# Patient Record
Sex: Male | Born: 1970 | Race: White | Hispanic: No | Smoking: Current every day smoker
Health system: Southern US, Community
[De-identification: ages and names within clinical notes are randomized; demographics above are authoritative.]

## PROBLEM LIST (undated history)

## (undated) DIAGNOSIS — F988 Other specified behavioral and emotional disorders with onset usually occurring in childhood and adolescence: Secondary | ICD-10-CM

## (undated) DIAGNOSIS — G8929 Other chronic pain: Secondary | ICD-10-CM

## (undated) DIAGNOSIS — M1990 Unspecified osteoarthritis, unspecified site: Secondary | ICD-10-CM

## (undated) DIAGNOSIS — F32A Depression, unspecified: Secondary | ICD-10-CM

## (undated) DIAGNOSIS — R569 Unspecified convulsions: Secondary | ICD-10-CM

## (undated) DIAGNOSIS — F329 Major depressive disorder, single episode, unspecified: Secondary | ICD-10-CM

## (undated) DIAGNOSIS — M549 Dorsalgia, unspecified: Secondary | ICD-10-CM

## (undated) DIAGNOSIS — J449 Chronic obstructive pulmonary disease, unspecified: Secondary | ICD-10-CM

## (undated) HISTORY — DX: Unspecified osteoarthritis, unspecified site: M19.90

## (undated) HISTORY — DX: Unspecified convulsions: R56.9

## (undated) HISTORY — DX: Chronic obstructive pulmonary disease, unspecified: J44.9

---

## 2001-12-02 ENCOUNTER — Emergency Department (HOSPITAL_COMMUNITY): Admission: EM | Admit: 2001-12-02 | Discharge: 2001-12-02 | Payer: Self-pay | Admitting: *Deleted

## 2001-12-02 ENCOUNTER — Encounter: Payer: Self-pay | Admitting: *Deleted

## 2002-10-14 ENCOUNTER — Encounter: Payer: Self-pay | Admitting: Emergency Medicine

## 2002-10-14 ENCOUNTER — Emergency Department (HOSPITAL_COMMUNITY): Admission: EM | Admit: 2002-10-14 | Discharge: 2002-10-14 | Payer: Self-pay | Admitting: Emergency Medicine

## 2004-12-30 ENCOUNTER — Emergency Department (HOSPITAL_COMMUNITY): Admission: EM | Admit: 2004-12-30 | Discharge: 2004-12-31 | Payer: Self-pay | Admitting: Emergency Medicine

## 2009-03-12 ENCOUNTER — Emergency Department (HOSPITAL_COMMUNITY): Admission: EM | Admit: 2009-03-12 | Discharge: 2009-03-13 | Payer: Self-pay | Admitting: Emergency Medicine

## 2009-03-13 ENCOUNTER — Inpatient Hospital Stay (HOSPITAL_COMMUNITY): Admission: EM | Admit: 2009-03-13 | Discharge: 2009-03-14 | Payer: Self-pay | Admitting: *Deleted

## 2009-03-13 ENCOUNTER — Ambulatory Visit: Payer: Self-pay | Admitting: *Deleted

## 2009-03-15 ENCOUNTER — Inpatient Hospital Stay (HOSPITAL_COMMUNITY): Admission: EM | Admit: 2009-03-15 | Discharge: 2009-03-17 | Payer: Self-pay | Admitting: Emergency Medicine

## 2009-03-17 ENCOUNTER — Inpatient Hospital Stay (HOSPITAL_COMMUNITY): Admission: AD | Admit: 2009-03-17 | Discharge: 2009-03-24 | Payer: Self-pay | Admitting: *Deleted

## 2009-04-01 ENCOUNTER — Emergency Department (HOSPITAL_COMMUNITY): Admission: EM | Admit: 2009-04-01 | Discharge: 2009-04-01 | Payer: Self-pay | Admitting: Emergency Medicine

## 2009-04-07 ENCOUNTER — Emergency Department (HOSPITAL_COMMUNITY): Admission: EM | Admit: 2009-04-07 | Discharge: 2009-04-08 | Payer: Self-pay | Admitting: Emergency Medicine

## 2009-04-12 ENCOUNTER — Emergency Department: Payer: Self-pay | Admitting: Unknown Physician Specialty

## 2009-04-15 ENCOUNTER — Emergency Department (HOSPITAL_COMMUNITY): Admission: EM | Admit: 2009-04-15 | Discharge: 2009-04-15 | Payer: Self-pay | Admitting: Emergency Medicine

## 2009-06-22 ENCOUNTER — Emergency Department (HOSPITAL_COMMUNITY): Admission: EM | Admit: 2009-06-22 | Discharge: 2009-06-22 | Payer: Self-pay | Admitting: Emergency Medicine

## 2009-06-23 ENCOUNTER — Ambulatory Visit: Payer: Self-pay | Admitting: Psychiatry

## 2009-06-23 ENCOUNTER — Inpatient Hospital Stay (HOSPITAL_COMMUNITY): Admission: AD | Admit: 2009-06-23 | Discharge: 2009-06-30 | Payer: Self-pay | Admitting: Psychiatry

## 2009-09-14 ENCOUNTER — Emergency Department (HOSPITAL_COMMUNITY): Admission: EM | Admit: 2009-09-14 | Discharge: 2009-09-15 | Payer: Self-pay | Admitting: Emergency Medicine

## 2009-09-15 ENCOUNTER — Inpatient Hospital Stay (HOSPITAL_COMMUNITY): Admission: AD | Admit: 2009-09-15 | Discharge: 2009-09-22 | Payer: Self-pay | Admitting: Psychiatry

## 2009-09-16 ENCOUNTER — Ambulatory Visit: Payer: Self-pay | Admitting: Psychiatry

## 2009-12-06 ENCOUNTER — Emergency Department (HOSPITAL_COMMUNITY): Admission: EM | Admit: 2009-12-06 | Discharge: 2009-12-08 | Payer: Self-pay | Admitting: Emergency Medicine

## 2009-12-08 ENCOUNTER — Inpatient Hospital Stay (HOSPITAL_COMMUNITY): Admission: AD | Admit: 2009-12-08 | Discharge: 2009-12-15 | Payer: Self-pay | Admitting: Psychiatry

## 2009-12-08 ENCOUNTER — Ambulatory Visit: Payer: Self-pay | Admitting: Psychiatry

## 2009-12-16 ENCOUNTER — Emergency Department (HOSPITAL_COMMUNITY): Admission: EM | Admit: 2009-12-16 | Discharge: 2009-12-17 | Payer: Self-pay | Admitting: Emergency Medicine

## 2009-12-17 ENCOUNTER — Inpatient Hospital Stay (HOSPITAL_COMMUNITY): Admission: RE | Admit: 2009-12-17 | Discharge: 2009-12-24 | Payer: Self-pay | Admitting: Psychiatry

## 2009-12-17 ENCOUNTER — Ambulatory Visit: Payer: Self-pay | Admitting: Psychiatry

## 2009-12-30 ENCOUNTER — Other Ambulatory Visit: Payer: Self-pay | Admitting: Emergency Medicine

## 2009-12-30 ENCOUNTER — Other Ambulatory Visit: Payer: Self-pay

## 2009-12-30 ENCOUNTER — Emergency Department (HOSPITAL_COMMUNITY): Admission: EM | Admit: 2009-12-30 | Discharge: 2010-01-01 | Payer: Self-pay | Admitting: Emergency Medicine

## 2010-03-29 ENCOUNTER — Emergency Department (HOSPITAL_COMMUNITY): Admission: EM | Admit: 2010-03-29 | Discharge: 2010-03-29 | Payer: Self-pay | Admitting: Emergency Medicine

## 2010-03-29 ENCOUNTER — Emergency Department (HOSPITAL_COMMUNITY): Admission: EM | Admit: 2010-03-29 | Discharge: 2010-03-30 | Payer: Self-pay | Admitting: Family Medicine

## 2010-03-30 ENCOUNTER — Ambulatory Visit: Payer: Self-pay | Admitting: Psychiatry

## 2010-03-30 ENCOUNTER — Inpatient Hospital Stay (HOSPITAL_COMMUNITY): Admission: AD | Admit: 2010-03-30 | Discharge: 2010-04-01 | Payer: Self-pay | Admitting: Psychiatry

## 2010-04-01 ENCOUNTER — Emergency Department (HOSPITAL_COMMUNITY): Admission: EM | Admit: 2010-04-01 | Discharge: 2010-04-01 | Payer: Self-pay | Admitting: Emergency Medicine

## 2010-10-30 ENCOUNTER — Emergency Department (HOSPITAL_COMMUNITY)
Admission: EM | Admit: 2010-10-30 | Discharge: 2010-10-30 | Payer: Self-pay | Source: Home / Self Care | Admitting: Emergency Medicine

## 2011-01-18 LAB — URINALYSIS, ROUTINE W REFLEX MICROSCOPIC
Bilirubin Urine: NEGATIVE
Glucose, UA: NEGATIVE mg/dL
Hgb urine dipstick: NEGATIVE
Ketones, ur: NEGATIVE mg/dL
Nitrite: NEGATIVE
Protein, ur: NEGATIVE mg/dL
Specific Gravity, Urine: 1.026 (ref 1.005–1.030)
Urobilinogen, UA: 0.2 mg/dL (ref 0.0–1.0)
pH: 5.5 (ref 5.0–8.0)

## 2011-01-18 LAB — POCT I-STAT, CHEM 8
Calcium, Ion: 1.13 mmol/L (ref 1.12–1.32)
Creatinine, Ser: 1.1 mg/dL (ref 0.4–1.5)
Hemoglobin: 12.9 g/dL — ABNORMAL LOW (ref 13.0–17.0)
Sodium: 140 mEq/L (ref 135–145)
TCO2: 25 mmol/L (ref 0–100)

## 2011-01-18 LAB — RAPID URINE DRUG SCREEN, HOSP PERFORMED
Amphetamines: NOT DETECTED
Cocaine: NOT DETECTED
Opiates: POSITIVE — AB
Tetrahydrocannabinol: NOT DETECTED

## 2011-01-18 LAB — DIFFERENTIAL
Basophils Relative: 0 % (ref 0–1)
Eosinophils Absolute: 0.3 10*3/uL (ref 0.0–0.7)
Monocytes Relative: 11 % (ref 3–12)
Neutrophils Relative %: 72 % (ref 43–77)

## 2011-01-18 LAB — CBC
MCHC: 35 g/dL (ref 30.0–36.0)
MCV: 93.3 fL (ref 78.0–100.0)
Platelets: 251 10*3/uL (ref 150–400)

## 2011-01-18 LAB — SALICYLATE LEVEL: Salicylate Lvl: <4 mg/dL (ref 2.8–20.0)

## 2011-01-20 LAB — CBC
Hemoglobin: 13.5 g/dL (ref 13.0–17.0)
MCHC: 33.7 g/dL (ref 30.0–36.0)
MCV: 92.9 fL (ref 78.0–100.0)
MCV: 94.7 fL (ref 78.0–100.0)
Platelets: 269 10*3/uL (ref 150–400)
RBC: 4.24 MIL/uL (ref 4.22–5.81)
RDW: 13.6 % (ref 11.5–15.5)
RDW: 13.6 % (ref 11.5–15.5)
WBC: 6 10*3/uL (ref 4.0–10.5)

## 2011-01-20 LAB — RAPID URINE DRUG SCREEN, HOSP PERFORMED
Benzodiazepines: POSITIVE — AB
Benzodiazepines: POSITIVE — AB
Cocaine: NOT DETECTED
Cocaine: NOT DETECTED
Opiates: NOT DETECTED
Opiates: NOT DETECTED
Tetrahydrocannabinol: NOT DETECTED

## 2011-01-20 LAB — URINALYSIS, ROUTINE W REFLEX MICROSCOPIC
Bilirubin Urine: NEGATIVE
Bilirubin Urine: NEGATIVE
Glucose, UA: NEGATIVE mg/dL
Glucose, UA: NEGATIVE mg/dL
Ketones, ur: NEGATIVE mg/dL
Ketones, ur: NEGATIVE mg/dL
Nitrite: NEGATIVE
Specific Gravity, Urine: 1.036 — ABNORMAL HIGH (ref 1.005–1.030)
Specific Gravity, Urine: 1.021 (ref 1.005–1.030)
pH: 5.5 (ref 5.0–8.0)
pH: 7.5 (ref 5.0–8.0)

## 2011-01-20 LAB — DIFFERENTIAL
Eosinophils Absolute: 0.2 10*3/uL (ref 0.0–0.7)
Eosinophils Relative: 2 % (ref 0–5)
Lymphocytes Relative: 25 % (ref 12–46)
Lymphocytes Relative: 25 % (ref 12–46)
Lymphs Abs: 1.5 10*3/uL (ref 0.7–4.0)
Lymphs Abs: 1.8 10*3/uL (ref 0.7–4.0)
Monocytes Absolute: 1.1 10*3/uL — ABNORMAL HIGH (ref 0.1–1.0)
Monocytes Relative: 18 % — ABNORMAL HIGH (ref 3–12)
Monocytes Relative: 12 % (ref 3–12)
Neutro Abs: 3.3 10*3/uL (ref 1.7–7.7)
Neutro Abs: 4.3 10*3/uL (ref 1.7–7.7)
Neutrophils Relative %: 61 % (ref 43–77)

## 2011-01-20 LAB — COMPREHENSIVE METABOLIC PANEL
AST: 42 U/L — ABNORMAL HIGH (ref 0–37)
Albumin: 3.9 g/dL (ref 3.5–5.2)
BUN: 18 mg/dL (ref 6–23)
CO2: 29 mEq/L (ref 19–32)
Calcium: 9.2 mg/dL (ref 8.4–10.5)
Calcium: 9 mg/dL (ref 8.4–10.5)
Chloride: 99 mEq/L (ref 96–112)
Creatinine, Ser: 1.07 mg/dL (ref 0.4–1.5)
Creatinine, Ser: 1.05 mg/dL (ref 0.4–1.5)
GFR calc Af Amer: >60 mL/min (ref >60)
GFR calc non Af Amer: >60 mL/min (ref >60)
Glucose, Bld: 91 mg/dL (ref 70–99)
Total Protein: 7.8 g/dL (ref 6.0–8.3)
Total Protein: 7.2 g/dL (ref 6.0–8.3)

## 2011-01-20 LAB — POCT CARDIAC MARKERS: Troponin i, poc: <0.05 ng/mL (ref 0.00–0.09)

## 2011-01-20 LAB — ACETAMINOPHEN LEVEL: Acetaminophen (Tylenol), Serum: <10 ug/mL — ABNORMAL LOW (ref 10–30)

## 2011-01-24 LAB — RAPID URINE DRUG SCREEN, HOSP PERFORMED
Amphetamines: NOT DETECTED
Barbiturates: NOT DETECTED
Cocaine: NOT DETECTED
Opiates: POSITIVE — AB

## 2011-01-24 LAB — DIFFERENTIAL
Basophils Absolute: 0.1 10*3/uL (ref 0.0–0.1)
Basophils Relative: 1 % (ref 0–1)
Eosinophils Absolute: 0.1 10*3/uL (ref 0.0–0.7)
Monocytes Absolute: 1.2 10*3/uL — ABNORMAL HIGH (ref 0.1–1.0)
Monocytes Relative: 16 % — ABNORMAL HIGH (ref 3–12)
Neutrophils Relative %: 64 % (ref 43–77)

## 2011-01-24 LAB — CBC
Hemoglobin: 14.7 g/dL (ref 13.0–17.0)
MCHC: 34.2 g/dL (ref 30.0–36.0)
MCV: 94 fL (ref 78.0–100.0)
RBC: 4.58 MIL/uL (ref 4.22–5.81)
RDW: 13.8 % (ref 11.5–15.5)

## 2011-01-24 LAB — POCT I-STAT, CHEM 8
Calcium, Ion: 1.1 mmol/L — ABNORMAL LOW (ref 1.12–1.32)
Creatinine, Ser: 1 mg/dL (ref 0.4–1.5)
Glucose, Bld: 88 mg/dL (ref 70–99)
Hemoglobin: 15.6 g/dL (ref 13.0–17.0)
Potassium: 3.9 mEq/L (ref 3.5–5.1)

## 2011-02-03 LAB — COMPREHENSIVE METABOLIC PANEL
ALT: 17 U/L (ref 0–53)
AST: 20 U/L (ref 0–37)
Alkaline Phosphatase: 39 U/L (ref 39–117)
CO2: 27 mEq/L (ref 19–32)
Calcium: 8.8 mg/dL (ref 8.4–10.5)
GFR calc Af Amer: >60 mL/min (ref >60)
GFR calc non Af Amer: >60 mL/min (ref >60)
Glucose, Bld: 98 mg/dL (ref 70–99)
Potassium: 3.5 mEq/L (ref 3.5–5.1)
Sodium: 139 mEq/L (ref 135–145)

## 2011-02-03 LAB — RAPID URINE DRUG SCREEN, HOSP PERFORMED: Tetrahydrocannabinol: NOT DETECTED

## 2011-02-03 LAB — ETHANOL: Alcohol, Ethyl (B): <5 mg/dL (ref 0–10)

## 2011-02-03 LAB — DIFFERENTIAL
Basophils Relative: 0 % (ref 0–1)
Eosinophils Absolute: 0.2 10*3/uL (ref 0.0–0.7)
Eosinophils Relative: 2 % (ref 0–5)
Lymphs Abs: 1.8 10*3/uL (ref 0.7–4.0)
Monocytes Relative: 11 % (ref 3–12)
Neutrophils Relative %: 65 % (ref 43–77)

## 2011-02-03 LAB — ACETAMINOPHEN LEVEL: Acetaminophen (Tylenol), Serum: <10 ug/mL — ABNORMAL LOW (ref 10–30)

## 2011-02-03 LAB — CBC
Hemoglobin: 13.6 g/dL (ref 13.0–17.0)
MCHC: 33.8 g/dL (ref 30.0–36.0)
RBC: 4.28 MIL/uL (ref 4.22–5.81)
WBC: 8.1 10*3/uL (ref 4.0–10.5)

## 2011-02-04 ENCOUNTER — Inpatient Hospital Stay (HOSPITAL_COMMUNITY)
Admission: EM | Admit: 2011-02-04 | Discharge: 2011-02-05 | DRG: 918 | Disposition: A | Discharge status: Other Acute Inpatient Hospital | Payer: Self-pay | Attending: Internal Medicine | Admitting: Internal Medicine

## 2011-02-04 DIAGNOSIS — Y92009 Unspecified place in unspecified non-institutional (private) residence as the place of occurrence of the external cause: Secondary | ICD-10-CM

## 2011-02-04 DIAGNOSIS — M4800 Spinal stenosis, site unspecified: Secondary | ICD-10-CM | POA: Diagnosis present

## 2011-02-04 DIAGNOSIS — T43502A Poisoning by unspecified antipsychotics and neuroleptics, intentional self-harm, initial encounter: Secondary | ICD-10-CM | POA: Diagnosis present

## 2011-02-04 DIAGNOSIS — T438X1A Poisoning by other psychotropic drugs, accidental (unintentional), initial encounter: Principal | ICD-10-CM | POA: Diagnosis present

## 2011-02-04 DIAGNOSIS — T438X4A Poisoning by other psychotropic drugs, undetermined, initial encounter: Principal | ICD-10-CM | POA: Diagnosis present

## 2011-02-04 DIAGNOSIS — F319 Bipolar disorder, unspecified: Secondary | ICD-10-CM | POA: Diagnosis present

## 2011-02-04 DIAGNOSIS — M479 Spondylosis, unspecified: Secondary | ICD-10-CM | POA: Diagnosis present

## 2011-02-04 LAB — CBC
HCT: 47.5 % (ref 39.0–52.0)
MCH: 30.8 pg (ref 26.0–34.0)
MCHC: 33.7 g/dL (ref 30.0–36.0)
MCV: 91.3 fL (ref 78.0–100.0)
Platelets: 194 10*3/uL (ref 150–400)
RDW: 13.5 % (ref 11.5–15.5)

## 2011-02-04 LAB — ACETAMINOPHEN LEVEL: Acetaminophen (Tylenol), Serum: <10 ug/mL — ABNORMAL LOW (ref 10–30)

## 2011-02-04 LAB — SALICYLATE LEVEL: Salicylate Lvl: <4 mg/dL (ref 2.8–20.0)

## 2011-02-04 LAB — LITHIUM LEVEL: Lithium Lvl: 1.6 mEq/L (ref 0.80–1.40)

## 2011-02-04 LAB — COMPREHENSIVE METABOLIC PANEL
Alkaline Phosphatase: 44 U/L (ref 39–117)
BUN: 14 mg/dL (ref 6–23)
Calcium: 10.1 mg/dL (ref 8.4–10.5)
Creatinine, Ser: 1.11 mg/dL (ref 0.4–1.5)
Glucose, Bld: 95 mg/dL (ref 70–99)
Total Protein: 8.8 g/dL — ABNORMAL HIGH (ref 6.0–8.3)

## 2011-02-04 LAB — DIFFERENTIAL
Eosinophils Absolute: 0.3 10*3/uL (ref 0.0–0.7)
Eosinophils Relative: 4 % (ref 0–5)
Lymphocytes Relative: 28 % (ref 12–46)
Lymphs Abs: 2.1 10*3/uL (ref 0.7–4.0)
Monocytes Absolute: 0.7 10*3/uL (ref 0.1–1.0)

## 2011-02-04 LAB — URINALYSIS, ROUTINE W REFLEX MICROSCOPIC
Glucose, UA: NEGATIVE mg/dL
Ketones, ur: NEGATIVE mg/dL
Protein, ur: NEGATIVE mg/dL
Urobilinogen, UA: 0.2 mg/dL (ref 0.0–1.0)

## 2011-02-04 LAB — ETHANOL: Alcohol, Ethyl (B): <5 mg/dL (ref 0–10)

## 2011-02-04 LAB — RAPID URINE DRUG SCREEN, HOSP PERFORMED: Benzodiazepines: POSITIVE — AB

## 2011-02-05 ENCOUNTER — Inpatient Hospital Stay (HOSPITAL_COMMUNITY)
Admission: AD | Admit: 2011-02-05 | Discharge: 2011-02-12 | DRG: 885 | Disposition: A | Discharge status: Home / Self Care | Payer: PRIVATE HEALTH INSURANCE | Attending: Psychiatry | Admitting: Psychiatry

## 2011-02-05 DIAGNOSIS — F319 Bipolar disorder, unspecified: Principal | ICD-10-CM | POA: Diagnosis present

## 2011-02-05 DIAGNOSIS — F909 Attention-deficit hyperactivity disorder, unspecified type: Secondary | ICD-10-CM | POA: Diagnosis present

## 2011-02-05 DIAGNOSIS — F4310 Post-traumatic stress disorder, unspecified: Secondary | ICD-10-CM | POA: Diagnosis present

## 2011-02-05 DIAGNOSIS — Z634 Disappearance and death of family member: Secondary | ICD-10-CM

## 2011-02-05 LAB — BASIC METABOLIC PANEL
BUN: 9 mg/dL (ref 6–23)
Chloride: 108 mEq/L (ref 96–112)
GFR calc Af Amer: >60 mL/min (ref >60)
Potassium: 4.3 mEq/L (ref 3.5–5.1)

## 2011-02-05 LAB — CBC
MCV: 92.2 fL (ref 78.0–100.0)
Platelets: 163 10*3/uL (ref 150–400)
RBC: 4.37 MIL/uL (ref 4.22–5.81)
WBC: 8.1 10*3/uL (ref 4.0–10.5)

## 2011-02-05 LAB — LITHIUM LEVEL: Lithium Lvl: 0.86 mEq/L (ref 0.80–1.40)

## 2011-02-06 DIAGNOSIS — F329 Major depressive disorder, single episode, unspecified: Secondary | ICD-10-CM

## 2011-02-06 DIAGNOSIS — T438X4A Poisoning by other psychotropic drugs, undetermined, initial encounter: Secondary | ICD-10-CM

## 2011-02-06 LAB — URINALYSIS, ROUTINE W REFLEX MICROSCOPIC
Nitrite: NEGATIVE
Specific Gravity, Urine: 1.024 (ref 1.005–1.030)
pH: 6 (ref 5.0–8.0)

## 2011-02-06 LAB — POCT I-STAT, CHEM 8
BUN: 20 mg/dL (ref 6–23)
Calcium, Ion: 1.14 mmol/L (ref 1.12–1.32)
Chloride: 106 meq/L (ref 96–112)
Creatinine, Ser: 1.1 mg/dL (ref 0.4–1.5)
Glucose, Bld: 89 mg/dL (ref 70–99)
HCT: 46 % (ref 39.0–52.0)
Hemoglobin: 15.6 g/dL (ref 13.0–17.0)
Potassium: 4 meq/L (ref 3.5–5.1)
Sodium: 142 meq/L (ref 135–145)
TCO2: 27 mmol/L (ref 0–100)

## 2011-02-06 LAB — DIFFERENTIAL
Basophils Relative: 1 % (ref 0–1)
Lymphs Abs: 2.2 10*3/uL (ref 0.7–4.0)
Monocytes Absolute: 0.8 10*3/uL (ref 0.1–1.0)
Monocytes Relative: 11 % (ref 3–12)
Neutro Abs: 4.7 10*3/uL (ref 1.7–7.7)
Neutrophils Relative %: 59 % (ref 43–77)

## 2011-02-06 LAB — HEPATIC FUNCTION PANEL
AST: 23 U/L (ref 0–37)
Albumin: 4.3 g/dL (ref 3.5–5.2)
Alkaline Phosphatase: 55 U/L (ref 39–117)
Bilirubin, Direct: <0.1 mg/dL (ref 0.0–0.3)
Total Bilirubin: 0.6 mg/dL (ref 0.3–1.2)

## 2011-02-06 LAB — RAPID URINE DRUG SCREEN, HOSP PERFORMED
Barbiturates: NOT DETECTED
Cocaine: NOT DETECTED
Opiates: NOT DETECTED

## 2011-02-06 LAB — CBC
Hemoglobin: 14.9 g/dL (ref 13.0–17.0)
MCHC: 34.1 g/dL (ref 30.0–36.0)
MCV: 94.6 fL (ref 78.0–100.0)
RBC: 4.63 MIL/uL (ref 4.22–5.81)
WBC: 7.9 10*3/uL (ref 4.0–10.5)

## 2011-02-06 NOTE — Discharge Summary (Signed)
[name], [name]               ACCOUNT NO.:  [id number]  MEDICAL RECORD NO.:  [id number]           PATIENT TYPE:  I  LOCATION:  1520                         FACILITY:  [facility]  PHYSICIAN:  Corey Nap, MD  DATE OF BIRTH:  02-07-1971  DATE OF ADMISSION:  02/04/2011 DATE OF DISCHARGE:  02/05/2011                        DISCHARGE SUMMARY - REFERRING   PRIMARY CARE PHYSICIAN:  Unassigned.  CONSULTANTS INVOLVED IN THE CASE:  Psychiatry, Eulogio Ditch, M.D.  DISCHARGE/TRANSFER DIAGNOSIS: 1. Lithium overdose, intentional. 2. Suicidal attempt. 3. Bipolar affective disorder. 4. 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 5. 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  HISTORY OF PRESENT ILLNESS:  The patient is a 40 year old, Caucasian male with a history of bipolar affective disorder and chronic pain syndrome secondary to spinal stenosis who was admitted to the hospital on February 04, 2011 by Dr. Peggye Pitt, with a history of having taken an overdose of Lithium tablets.  The patient was said to have taken a total of about 20 tablets.  The patient was said to have taken the loss of his uncle very sadly and was very depressed, and subsequently took 20 tablets of 300 mg of lithium in an attempt to commit suicide.  After ingestion, the patient decided to call his mother informing her what he had done, and subsequently, the mother called emergency medical service who brought the patient to the emergency room.  There is, however, no history of loss of consciousness.  There was no history of fever, no chills, no rigor.  In the emergency room, the patient had an NG tube inserted with subsequent lavage, and thereafter admitted for stabilization and for further evaluation.  PREADMISSION MEDICATIONS: 1. Lithium 200 mg one p.o. t.i.d. 2. Remeron 30 mg p.o. at bedtime. 3. Doxepin 100 mg p.o. at bedtime. 4. Klonopin 1 mg p.o. t.i.d. 5. Lortab 7.5 mg one p.o.  t.i.d.  ALLERGIES:  ASPIRIN, BENADRYL, TORADOL, and TRAMADOL.  PAST SURGICAL HISTORY:  No known past surgical history.  SOCIAL HISTORY:  Negative for alcohol.  Tobacco use:  He smokes about 2- 3 sticks of cigarettes per day for several years.  FAMILY HISTORY:  Family history is said to be positive for coronary artery disease and CVA.  REVIEW OF SYSTEMS:  Essentially documented in the initial history and physical.  At that time, the patient was seen by the admitting physician.  PHYSICAL EXAMINATION:  VITAL SIGNS:  Blood pressure is 124/84, heart rate 90, respiratory 18.  He was saturating 100% on room air, and temperature is 97.4. HEENT: Pupils were reactive to light, and extraocular muscles intact. NECK:  No jugular venous distention.  No carotid bruit.  No lymphadenopathy. CHEST:  Clear to auscultation.  Heart sounds are one and two. ABDOMEN:  Soft, nontender.  Liver, spleen and kidney not palpable. Bowel sounds are positive. EXTREMITIES:  No pedal edema. NEUROLOGIC EXAM:  Nonfocal. MUSCULOSKELETAL SYSTEM:  Unremarkable. SKIN:  Normal turgor.  LAB DATA:  Initial complete blood count with differential showed WBC of 7.2, hemoglobin of 16.0, hematocrit of 47.5, MCV of 91.2 with a platelet count of 194, normal differentials.  Comprehensive  metabolic panel showed sodium of 142, potassium of 4.5, chloride of 102 with a bicarbonate of 31, glucose is 95, BUN is 14, creatinine is 1.11.  LFTs normal.  Alcohol level is less than 5.  Lithium level is less than 10. His 282.... Level is less than 4.  Blood lithium level is 1.60 elevated. Urinalysis unremarkable.  Urine drug screen positive for benzodiazepine. A repeat complete blood count with no differential done on February 05, 2011 showed WBC of 8.1, hemoglobin of 13.2, hematocrit of 40.3, MCV of 92.2 with a platelet count of 163 and basic metabolic panel showed sodium of 140, potassium of 4.3, chloride of 108 with a bicarbonate of 28,  glucose is 85, BUN is 9, creatinine is 1.18.  Draw lithium level done on February 05, 2011 was 0.86, normal range.  HOSPITAL COURSE:  The patient was admitted to telemetry with impression of lithium toxicity, and he was placed on one-to-one watch, and after NG tube lavage in the emergency room, he was continued with normal saline to graduate to 0.25 mL an hour and Zofran for nausea.  He was also given Lovenox 40 mg subcutaneous q.24h for DVT prophylaxis.  The patient was restarted on some of his home medications, and this included Klonopin 1 mg p.o. t.i.d., Klonopin 1 mg p.o. once a day, doxepin 100 mg p.o. once a day, doxepin 100 mg p.o. q.h.s. and then mirtazapine at 30 mg p.o. once a day.  The patient was seen by me for the very first time on this admission today, which is February 05, 2011 at about 3:39 p.m. He gave an additional history of spinal stenosis and chronic pain syndrome because of his stenosis and also degenerative disease of the spinal cord.  He also still complained about him wanting to hurt himself, but denied any homicidal ideations.  Examination of the patient showed the patient not to be in distress.  He was, however, jittery.  Vital signs:  Blood pressure is 120/75, pulse 72, respiratory rate 20, temperature is 98.0. Medically stable.  He had was evaluated by in-house psychiatry, Dr. Rogers Blocker, who recommended that if the patient is medically stable, he should be transferred to [facility] for further management.  So far the patient is medically stable.  PLAN:  For the patient to be discharged to [facility] for continued management by the psychiatrist.  Activity will be as tolerated, and diet will be a regular diet and his chronic pain will be managed on oxycodone 5 mg p.o. q.4 p.r.n.  MEDICATIONS:  Medications to be taken at the facility will include: 1. Oxycodone 5 mg one p.o. q.4 p.r.n. 2. Doxepin 100 mg one p.o. daily at bedtime. 3. Klonopin  (clonazepam) 1 mg p.o. three times a day. 4. Lithium carbonate 200 mg one p.o. t.i.d. 5. Lortab (hydrocodone/APAP) 7.5/500, 1 p.o. three times a day. 6. Remeron (mirtazapine) 30 mg one p.o. daily at bedtime.     Corey Nap, MD     CN/MEDQ  D:  02/05/2011  T:  02/05/2011  Job:  [id number]  cc:   Behavioral Health  Electronically Signed by Corey Nap  on 02/06/2011 06:48:13 AM

## 2011-02-07 NOTE — H&P (Signed)
[name], [name]               ACCOUNT NO.:  [id number]  MEDICAL RECORD NO.:  [id number]           PATIENT TYPE:  LOCATION:  0500                          [facility]  [facility]  PHYSICIAN:  Marlis Edelson, DO        DATE OF BIRTH:  [birth year]  DATE OF ADMISSION:  02/05/2011 DATE OF DISCHARGE:                      PSYCHIATRIC ADMISSION ASSESSMENT   This is on a 40 year old male involuntary petitioned.  HISTORY OF PRESENT ILLNESS:  The patient is here on papers that states that he had taken twenty 300 mg lithium tablets and  stated he did not want to live anymore.  The patient today states he was feeling very depressed.  His mother recently diagnosed with breast cancer.  His uncle passed away at the age of 7.  He has been sleeping well.  He states that he is employed. He is concerned about mother's illness and bereavement  PAST PSYCHIATRIC HISTORY:  The patient has been with [place] before approximately 1 year ago.  He has a history of bipolar disorder and ADHD.  He sees Dr. Tenny Craw in [place].  SOCIAL HISTORY:  The patient lives with his mother in [place].  He denies any legal troubles.  He is single, has no children.  FAMILY HISTORY:  None.  ALCOHOL/DRUG HISTORY:  He denies any alcohol or substance use.  PRIMARY CARE PROVIDER:  Dr. Hulen Skains in [place].  MEDICAL PROBLEMS:  History of degenerative disk disease.  MEDICATIONS: 1. Lithium 300 mg one t.i.d. 2. Remeron 30 mg nightly. 3. Klonopin 1 mg t.i.d. 4. Doxepin 100 mg nightly. 5. Lortab. 6. The patient does state that he has not been taking any pain     medications for at least 1 year.  DRUG ALLERGIES: 1. ASPIRIN. 2. BENADRYL. 3. TORADOL.  PHYSICAL EXAMINATION:  This is a well-nourished, well-developed male in no acute distress.  He offers no complaints.  LABORATORY DATA:  Urine drug screen positive for benzodiazepines, aspirin level less than 4, lithium level was elevated at 1.6, alcohol level less than  5.  MENTAL STATUS EXAM:  The patient is fully alert and cooperative. Remembers me from past admission.  Thought processes are coherent, goal directed.  No evidence of any psychotic symptoms.  He wants to get help. He is concerned about his mother.  AXIS I:  Bipolar disorder. AXIS II:  Deferred. AXIS III:  Status post lithium overdose, degenerative disk disease. AXIS IV:  Issues with mother's illness and bereavement. AXIS V:  Current is 35-40.  PLAN:  Our plan is to review the patient's transfer medications.  The patient reports that he has not been taking any pain medications for at least 1 year.  We will taper medications during his stay. Advise the patient to follow up with his primary care provider for pain issues.  We will continue with his Remeron and doxepin for sleep.  We will contact mother for concerns, safety issues.  Case manager will get a follow up appointment with Dr. Tenny Craw.     Landry Corporal, N.P.   ______________________________ Marlis Edelson, DO    JO/MEDQ  D:  02/06/2011  T:  02/06/2011  Job:  [id number] Electronically Signed by Limmie PatriciaP. on 02/07/2011 11:46:39 AM Electronically Signed by Marlis Edelson MD on 02/07/2011 09:38:38 PM

## 2011-02-08 LAB — BASIC METABOLIC PANEL
BUN: 11 mg/dL (ref 6–23)
CO2: 30 mEq/L (ref 19–32)
Calcium: 9 mg/dL (ref 8.4–10.5)
Creatinine, Ser: 1.09 mg/dL (ref 0.4–1.5)
GFR calc non Af Amer: >60 mL/min (ref >60)
Glucose, Bld: 90 mg/dL (ref 70–99)
Sodium: 139 mEq/L (ref 135–145)

## 2011-02-08 LAB — CBC
Hemoglobin: 13.5 g/dL (ref 13.0–17.0)
MCHC: 34.5 g/dL (ref 30.0–36.0)
Platelets: 226 10*3/uL (ref 150–400)
RDW: 14.3 % (ref 11.5–15.5)

## 2011-02-08 LAB — DIFFERENTIAL
Basophils Absolute: 0 10*3/uL (ref 0.0–0.1)
Basophils Relative: 1 % (ref 0–1)
Monocytes Absolute: 0.9 10*3/uL (ref 0.1–1.0)
Neutro Abs: 4.3 10*3/uL (ref 1.7–7.7)

## 2011-02-09 LAB — RENAL FUNCTION PANEL
Albumin: 4 g/dL (ref 3.5–5.2)
BUN: 16 mg/dL (ref 6–23)
CO2: 29 mEq/L (ref 19–32)
Chloride: 106 mEq/L (ref 96–112)
Creatinine, Ser: 1.16 mg/dL (ref 0.4–1.5)
Potassium: 4.4 mEq/L (ref 3.5–5.1)

## 2011-02-09 LAB — BASIC METABOLIC PANEL
BUN: 11 mg/dL (ref 6–23)
BUN: 12 mg/dL (ref 6–23)
CO2: 28 mEq/L (ref 19–32)
CO2: 28 mEq/L (ref 19–32)
Chloride: 104 mEq/L (ref 96–112)
Chloride: 106 mEq/L (ref 96–112)
Creatinine, Ser: 1.12 mg/dL (ref 0.4–1.5)
Creatinine, Ser: 1.29 mg/dL (ref 0.4–1.5)
Creatinine, Ser: 1.17 mg/dL (ref 0.4–1.5)
Creatinine, Ser: 1.18 mg/dL (ref 0.4–1.5)
GFR calc Af Amer: >60 mL/min (ref >60)
GFR calc Af Amer: >60 mL/min (ref >60)
GFR calc Af Amer: >60 mL/min (ref >60)
GFR calc Af Amer: >60 mL/min (ref >60)
GFR calc non Af Amer: >60 mL/min (ref >60)
GFR calc non Af Amer: >60 mL/min (ref >60)
Glucose, Bld: 94 mg/dL (ref 70–99)
Potassium: 3.9 mEq/L (ref 3.5–5.1)
Potassium: 4.5 mEq/L (ref 3.5–5.1)
Sodium: 137 mEq/L (ref 135–145)
Sodium: 139 mEq/L (ref 135–145)

## 2011-02-09 LAB — CBC
Hemoglobin: 15.5 g/dL (ref 13.0–17.0)
MCHC: 34.7 g/dL (ref 30.0–36.0)
MCHC: 34.5 g/dL (ref 30.0–36.0)
MCV: 92.5 fL (ref 78.0–100.0)
MCV: 93.4 fL (ref 78.0–100.0)
Platelets: 209 10*3/uL (ref 150–400)
RBC: 4.79 MIL/uL (ref 4.22–5.81)
RDW: 14.2 % (ref 11.5–15.5)

## 2011-02-09 LAB — RAPID URINE DRUG SCREEN, HOSP PERFORMED
Amphetamines: NOT DETECTED
Barbiturates: NOT DETECTED
Benzodiazepines: NOT DETECTED
Cocaine: NOT DETECTED
Opiates: NOT DETECTED
Opiates: NOT DETECTED
Tetrahydrocannabinol: NOT DETECTED

## 2011-02-09 LAB — DIFFERENTIAL
Basophils Relative: 0 % (ref 0–1)
Eosinophils Absolute: 0.1 10*3/uL (ref 0.0–0.7)
Monocytes Absolute: 1.1 10*3/uL — ABNORMAL HIGH (ref 0.1–1.0)
Monocytes Relative: 14 % — ABNORMAL HIGH (ref 3–12)

## 2011-02-09 LAB — COMPREHENSIVE METABOLIC PANEL
AST: 19 U/L (ref 0–37)
Albumin: 4.2 g/dL (ref 3.5–5.2)
Calcium: 9.7 mg/dL (ref 8.4–10.5)
Creatinine, Ser: 1.27 mg/dL (ref 0.4–1.5)
GFR calc Af Amer: >60 mL/min (ref >60)
GFR calc non Af Amer: >60 mL/min (ref >60)
Total Protein: 7.5 g/dL (ref 6.0–8.3)

## 2011-02-09 LAB — LITHIUM LEVEL
Lithium Lvl: 0.51 mEq/L — ABNORMAL LOW (ref 0.80–1.40)
Lithium Lvl: 0.83 mEq/L (ref 0.80–1.40)
Lithium Lvl: 1.23 mEq/L (ref 0.80–1.40)

## 2011-02-09 LAB — POCT I-STAT, CHEM 8
BUN: 29 mg/dL — ABNORMAL HIGH (ref 6–23)
Calcium, Ion: 1.21 mmol/L (ref 1.12–1.32)
Chloride: 106 mEq/L (ref 96–112)
Creatinine, Ser: 1.4 mg/dL (ref 0.4–1.5)
Glucose, Bld: 94 mg/dL (ref 70–99)

## 2011-02-09 LAB — URINALYSIS, ROUTINE W REFLEX MICROSCOPIC
Glucose, UA: NEGATIVE mg/dL
Ketones, ur: NEGATIVE mg/dL
pH: 8 (ref 5.0–8.0)
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
## 2011-02-23 ENCOUNTER — Emergency Department (HOSPITAL_COMMUNITY): Payer: PRIVATE HEALTH INSURANCE

## 2011-02-23 ENCOUNTER — Emergency Department (HOSPITAL_COMMUNITY)
Admission: EM | Admit: 2011-02-23 | Discharge: 2011-02-24 | Disposition: A | Discharge status: Home / Self Care | Payer: PRIVATE HEALTH INSURANCE | Attending: Emergency Medicine | Admitting: Emergency Medicine

## 2011-02-23 DIAGNOSIS — Z79899 Other long term (current) drug therapy: Secondary | ICD-10-CM | POA: Insufficient documentation

## 2011-02-23 DIAGNOSIS — M549 Dorsalgia, unspecified: Secondary | ICD-10-CM | POA: Insufficient documentation

## 2011-02-23 DIAGNOSIS — R296 Repeated falls: Secondary | ICD-10-CM | POA: Insufficient documentation

## 2011-02-23 DIAGNOSIS — F319 Bipolar disorder, unspecified: Secondary | ICD-10-CM | POA: Insufficient documentation

## 2011-02-23 DIAGNOSIS — M1990 Unspecified osteoarthritis, unspecified site: Secondary | ICD-10-CM | POA: Insufficient documentation

## 2011-02-24 ENCOUNTER — Emergency Department (HOSPITAL_COMMUNITY)
Admission: EM | Admit: 2011-02-24 | Discharge: 2011-02-24 | Disposition: A | Discharge status: Other Acute Inpatient Hospital | Payer: Self-pay | Attending: Emergency Medicine | Admitting: Emergency Medicine

## 2011-02-24 ENCOUNTER — Inpatient Hospital Stay (HOSPITAL_COMMUNITY)
Admission: AD | Admit: 2011-02-24 | Discharge: 2011-02-25 | DRG: 881 | Disposition: A | Discharge status: Home / Self Care | Payer: PRIVATE HEALTH INSURANCE | Source: Ambulatory Visit | Attending: Psychiatry | Admitting: Psychiatry

## 2011-02-24 DIAGNOSIS — R45851 Suicidal ideations: Secondary | ICD-10-CM | POA: Insufficient documentation

## 2011-02-24 DIAGNOSIS — Z79899 Other long term (current) drug therapy: Secondary | ICD-10-CM | POA: Insufficient documentation

## 2011-02-24 DIAGNOSIS — F319 Bipolar disorder, unspecified: Secondary | ICD-10-CM | POA: Insufficient documentation

## 2011-02-24 DIAGNOSIS — F341 Dysthymic disorder: Secondary | ICD-10-CM | POA: Insufficient documentation

## 2011-02-24 DIAGNOSIS — F3289 Other specified depressive episodes: Principal | ICD-10-CM

## 2011-02-24 DIAGNOSIS — F1910 Other psychoactive substance abuse, uncomplicated: Secondary | ICD-10-CM

## 2011-02-24 DIAGNOSIS — F329 Major depressive disorder, single episode, unspecified: Principal | ICD-10-CM

## 2011-02-24 DIAGNOSIS — M1990 Unspecified osteoarthritis, unspecified site: Secondary | ICD-10-CM | POA: Insufficient documentation

## 2011-02-24 LAB — BASIC METABOLIC PANEL
CO2: 26 mEq/L (ref 19–32)
Calcium: 9.1 mg/dL (ref 8.4–10.5)
Creatinine, Ser: 0.97 mg/dL (ref 0.4–1.5)
GFR calc Af Amer: >60 mL/min (ref >60)
Glucose, Bld: 98 mg/dL (ref 70–99)

## 2011-02-24 LAB — DIFFERENTIAL
Lymphocytes Relative: 18 % (ref 12–46)
Lymphs Abs: 1.2 10*3/uL (ref 0.7–4.0)
Monocytes Absolute: 0.7 10*3/uL (ref 0.1–1.0)
Monocytes Relative: 10 % (ref 3–12)
Neutro Abs: 4.7 10*3/uL (ref 1.7–7.7)

## 2011-02-24 LAB — RAPID URINE DRUG SCREEN, HOSP PERFORMED
Amphetamines: NOT DETECTED
Barbiturates: NOT DETECTED
Benzodiazepines: POSITIVE — AB

## 2011-02-24 LAB — CBC
HCT: 38.1 % — ABNORMAL LOW (ref 39.0–52.0)
Hemoglobin: 12.7 g/dL — ABNORMAL LOW (ref 13.0–17.0)
MCH: 30.1 pg (ref 26.0–34.0)
MCHC: 33.3 g/dL (ref 30.0–36.0)
MCV: 90.3 fL (ref 78.0–100.0)

## 2011-02-24 LAB — URINALYSIS, ROUTINE W REFLEX MICROSCOPIC
Glucose, UA: NEGATIVE mg/dL
Hgb urine dipstick: NEGATIVE
Protein, ur: NEGATIVE mg/dL

## 2011-02-25 ENCOUNTER — Emergency Department: Payer: Self-pay | Admitting: Emergency Medicine

## 2011-02-26 ENCOUNTER — Inpatient Hospital Stay: Payer: Self-pay | Admitting: Psychiatry

## 2011-02-28 NOTE — H&P (Signed)
[name], [name]               ACCOUNT NO.:  [id number]  MEDICAL RECORD NO.:  [id number]           PATIENT TYPE:  E  LOCATION:  WLED                         FACILITY:  [facility]  PHYSICIAN:  Peggye Pitt, M.D. DATE OF BIRTH:  10-02-1971  DATE OF ADMISSION:  02/04/2011 DATE OF DISCHARGE:                             HISTORY & PHYSICAL   This patient's primary care physician is currently unassigned.  CHIEF COMPLAINT:  A lithium overdose.  HISTORY OF PRESENT ILLNESS:  [name] is a 40 year old Caucasian gentleman who has a history of bipolar disorder with multiple admissions to [facility], who presents today after lithium overdose.  [name] states that earlier this week he lost his uncle who was more like a father to him.  Subsequently, he became very depressed and today at about 5:40 p.m. took the complete remainder of his lithium prescription which was 20 tablets of the 300 mg lithium.  He proceeded to call hismother who then called EMS and brought him into the hospital approximately an hour after his ingestion.  In the emergency department, an NG tube was placed where he had lavage and retrieval of several fragments of lithium tablets.  [name] is currently in remarkable good mental status, agrees that he needs help and is willing to go to [facility] if required.  ALLERGIES:  He has stated allergies to ASPIRIN, BENADRYL, TORADOL, and TRAMADOL.  PAST MEDICAL HISTORY:  Significant for bipolar disorder and degenerative disk disease.  HOME MEDICATIONS:  Per the patient's report include: 1. Lithium 300 mg 1 tablet 3 times a day. 2. Remeron 30 mg at bedtime. 3. Doxepin 100 mg at bedtime. 4. Klonopin 1 mg 3 times a day. 5. Lortab 7.5 mg 1 tablet 3 times a day.  SOCIAL HISTORY:  He denies any alcohol or illicit drug use.  Says he smokes about 2-3 cigarettes a day and has done so for many years.  FAMILY HISTORY:  Not significant for heart disease, cancers, or  stroke.  REVIEW OF SYSTEMS:  Negative except as mentioned in the history of present illness.  PHYSICAL EXAMINATION:  VITAL SIGNS:  On admission, blood pressure 124/84, heart rate 90, respirations 18, sats of 100% on room air, and temperature 97.4. GENERAL:  He is alert, awake, oriented x3 in no acute distress, pleasant and cooperative with exam. HEENT: Normocephalic, atraumatic.  Pupils are equally round and reactive to light with intact extraocular movements. NECK:  Supple.  No JVD, no lymphadenopathy, no bruits, no goiter. HEART:  Regular rate and rhythm with no murmurs, rubs, or gallops. LUNGS:  Clear to auscultation bilaterally. ABDOMEN:  Soft, nontender, nondistended.  Positive bowel sounds. EXTREMITIES:  He has no clubbing, cyanosis, or edema with positive pedal pulses. NEUROLOGIC EXAM:  Intact and nonfocal.  LABORATORY DATA:  On admission, sodium 141, potassium 4.5, chloride 103, bicarb 31, BUN 14, creatinine 1.11, glucose of 95.  All of his LFTs are within normal limits.  WBC 7.2, hemoglobin 16.0, platelets of 194. Alcohol level less than 5.  Tylenol level less than 5.  Aspirin level less than 4.  Lithium is elevated at 1.6.  A  urinalysis is negative and a UDS is positive for benzodiazepines.  No imaging studies were ordered in the emergency department.  ASSESSMENT AND PLAN:  Lithium overdose.  This was done as a suicide attempt.  He has a lithium level of 1.6 with upper limit of normal being 1.4.  So far, he has no signs of neurologic, cardiac, or gastrointestinal toxicity.  He does have good renal clearance.  At this point, there is no need for emergent hemodialysis.  I will recheck a lithium level in the morning.  If at any point, his lithium level is above 4 or above 2.5 with some sign of neuro, cardiac, or gastrointestinal toxicity, then he will need to be transferred to [facility] for emergent hemodialysis.  However, I do not anticipate a need for this.  He will certainly  need a psychiatric evaluation and this will need to be done in the morning.  For deep venous thrombosis prophylaxis, I will place him on Lovenox.     Peggye Pitt, M.D.     EH/MEDQ  D:  02/04/2011  T:  02/04/2011  Job:  [id number]  Electronically Signed by Peggye Pitt M.D. on 02/28/2011 09:36:09 AM

## 2011-03-02 NOTE — H&P (Signed)
  NAMEJOSUEL, [name]               ACCOUNT NO.:  [id number]  MEDICAL RECORD NO.:  [id number]           PATIENT TYPE:  I  LOCATION:  0307                          [facility]ACILITY:  [facility]  PHYSICIAN:  Anselm Jungling, MD  DATE OF BIRTH:  Feb 06, 1971  DATE OF ADMISSION:  04/25/Klein DATE OF DISCHARGE:                      PSYCHIATRIC ADMISSION ASSESSMENT   IDENTIFYING INFORMATION:  This is a 40 year old single Caucasian male. This is a voluntary admission.  HISTORY OF Corey PRESENT ILLNESS:  This is one of several [name] admissions for [name] who presents with a complaint of suicidal thoughts, stated that he was discharged from [name] 6 days ago and was given a 2-day supply of his medication.  He takes Suboxone, trazodone, and Remeron and says he has not been able to get his prescriptions which are at [facility] due to his finances.  He was also hospitalized on our Klein from [name], to [facility], [facility], for a lithium overdose.  He was instructed at that time not to take any benzodiazepines, narcotics, or doxepin.  He has reported that he did relapse on drugs and urine drug screen was noted to be positive for benzodiazepines and opiates.  OTHER DIAGNOSTIC STUDIES:  Unremarkable.  PHYSICAL EXAM:  Unremarkable.  MENTAL STATUS EXAM:  He presents today fully alert in full contact with reality with no acute withdrawal symptoms.  Denying any active suicidal thoughts.  He has refused to participate in group therapy as requested but has spent his time remaining in bed sleeping.  Thinking is logical and nonpsychotic.  Insight is adequate.  No dangerous thoughts.  PLAN:  Discharge him today.  Please refer to Corey discharge summary.     [name], N.P.   ______________________________ Anselm Jungling, MD    MAS/MEDQ  D:  04/26/Klein  T:  04/26/Klein  Job:  [id number]  Electronically Signed by [name] N.P. on 04/30/Klein 02:59:34  PM Electronically Signed by [name] MD on 05/01/Klein 10:32:43 AM

## 2011-03-02 NOTE — Discharge Summary (Signed)
  [name], [name]               ACCOUNT NO.:  [id number]  MEDICAL RECORD NO.:  [id number]           PATIENT TYPE:  I  LOCATION:  1610                          [facility]ACILITY:  [facility]  PHYSICIAN:  Anselm Jungling, MD  DATE OF BIRTH:  August 03, 1971  DATE OF ADMISSION:  02/24/2011 DATE OF DISCHARGE:  02/25/2011                              DISCHARGE SUMMARY   IDENTIFYING INFORMATION:  A 40 year old single Caucasian male with voluntary admission.  HISTORY OF PRESENT ILLNESS:  This is one of many [facility] admissions for [name], a 40 year old, who presents with depression, suicide risk and substance dependence.  Please refer to the admission note for further details.  He was discharged from our unit 2 weeks ago and was discharged last week from [facility].  He admits that he had relapsed on substances and evaluation in the emergency room revealed a urine drug screen positive for drugs of abuse, specifically benzodiazepines and opiates.  MENTAL FINDINGS:  He was admitted to our detox unit and presented as a well-nourished, normally-developed adult male, alert, fully oriented and in full contact with reality.  Mood is mildly depressed; denying any active suicidal thoughts and expressing a desire for help.  He has been somewhat vague as to his treatment goals and we have had a lot of difficulty getting him to leave his room, get out of bed or attend therapeutic groups and activities.  He has repeatedly refused to comply with staff requests to attend group therapy.  He demonstrates no withdrawal symptoms.  PLAN:  Discharge him today.  He will follow up with his previous appointments that were scheduled by [facility] in discharge medications are trazodone 300 mg p.o. q.h.s. and Remeron 15 mg q.h.s.  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     [name], N.P.   ______________________________ Anselm Jungling, MD    MAS/MEDQ  D:  02/25/2011  T:  02/25/2011  Job:  [id number]  Electronically Signed by [name] N.P. on 03/01/2011 02:59:39 PM Electronically Signed by [name] MD on 03/02/2011 10:32:45 AM

## 2011-03-16 NOTE — Discharge Summary (Signed)
[name], [name]               ACCOUNT NO.:  [id number]   MEDICAL RECORD NO.:  [id number]          PATIENT TYPE:  IPS   LOCATION:  0301                          FACILITY:  [facility]   PHYSICIAN:  Jasmine Pang, M.D. DATE OF BIRTH:  04/17/1971   DATE OF ADMISSION:  03/13/2009  DATE OF DISCHARGE:  03/14/2009                               DISCHARGE SUMMARY   IDENTIFICATION:  This is a 40 year old single white male who was  admitted on a voluntary basis on Mar 13, 2009.   HISTORY OF PRESENT ILLNESS:  The patient presents with a history of an  intentional overdosing a 9 Seroquel tablets 300 mg.  He states that he  did this out on the open.  He states he was helping to fall asleep and  never wake up.  He had taken himself to the emergency room.  He states  his stressor is that he had a breakup with his girlfriend of 4 years.  He has been noncompliant with his medications for the past couple of  months.  He denies any substance use.  He denies any history of  hallucinations.  This is his [facility] admission to [facility].  For further admission information, see psychiatric admission assessment.  Upon admission the patient was given an:  Axis I:  Diagnosis of mood disorder, not otherwise specified.  There  were no known medical problems on axis III.   PHYSICAL FINDINGS:  There were no acute physical or medical problems  noted.  His physical exam was done in the emergency department and was  reviewed with no significant findings.  He did receive charcoal and  Zofran for the overdose.   LABORATORY DATA:  Urine drug screen is positive for tricyclic salicylate  less than 4, acetaminophen level was less than 10, alcohol level less  than 5, lithium level less than 0.25 mg mEq/L.   HOSPITAL COURSE:  Upon admission, the patient was restarted on Prozac 20  mg daily, lithium 300 mg p.o. daily and 300 mg q.h.s., Seroquel 50 mg  p.o. t.i.d. p.r.n. anxiety and Seroquel 300 mg p.o. q.h.s.  He  was also  started on Neurontin 100 mg p.o. q.4 h. P.r.n. anxiety and Librium 25 mg  p.o. q.6 h. p.r.n.  Symptoms of withdrawal (states he was taking  Klonopin).  He was also started on Ambien 10 mg p.o. q.h.s. p.r.n.  insomnia.  In individual sessions, the patient was initially disheveled  with fair eye contact.  There was psychomotor retardation.  Speech was  soft and flow.  Mood was depressed and anxious.  Affect consistent with  mood.  There was no evidence of a thought disorder or psychosis.  The  patient was friendly and cooperative.  He stated all he felt needed to  happen was to restart his medications.  When he could he would be able  to get out of the hospital soon.  On Mar 14, 2009, mental status had  improved markedly from admission status.  Sleep was good.  Appetite was  good.  He was less depressed.  He was still  somewhat anxious, but less  so than upon admission.  Affect was consistent with mood.  There was no  suicidal or homicidal ideation.  No thoughts of self-injurious behavior.  No auditory or visual hallucinations.  No paranoia or delusions.  Thoughts were logical and goal-directed.  Thought content, no  predominant theme.  Cognitive was grossly intact.  Insight good.  Judgment good.  Impulse control good.  The patient found in [facility]  who will accept him today in [place].  [place].  His father  lives there and he wants.  He is anxious to be close to him.  It was  decided he was safe for discharge today.   DISCHARGE DIAGNOSES:  Axis I: Mood disorder, not otherwise specified.  Axis II: None.  Axis III: No known medical problems.  Axis IV: Severe (psychosocial problems and problems with occupation,  financial problems, burden of psychiatric illness).  Axis V: Global assessment of functioning was 50 upon discharge.  GAF was  35 upon admission.  GAF highest past year was 60-65.   DISCHARGE PLANS:  There was no specific activity level or diet   restriction.   POSTHOSPITAL CARE PLANS:  The patient will go to the [facility] on 05/17/21010.   DISCHARGE MEDICATIONS:  1. Prozac 20 mg daily.  2. Seroquel 300 mg at bedtime.  3. Lithium 300 mg 1 pill twice daily.      Jasmine Pang, M.D.  Electronically Signed     BHS/MEDQ  D:  03/14/2009  T:  03/15/2009  Job:  [id number]
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
## 2011-03-16 NOTE — H&P (Signed)
[name], [name]               ACCOUNT NO.:  [id number]   MEDICAL RECORD NO.:  [id number]          PATIENT TYPE:  IPS   LOCATION:  0301                          FACILITY:  [facility]   PHYSICIAN:  Jasmine Pang, M.D. DATE OF BIRTH:  [birth year]   DATE OF ADMISSION:  03/13/2009  DATE OF DISCHARGE:                       PSYCHIATRIC ADMISSION ASSESSMENT   This is on a 40 year old male that was voluntarily admitted on Mar 13, 2009.   HISTORY OF PRESENT ILLNESS:  The patient presents with a history of  intentional overdose, overdosing on 9 Seroquel tablets 300 mg.  He  states that he did this out in the open.  He states that he was hoping  to fall asleep and never wake up.  He had taken himself to the emergency  room.  He states his stressor is that he had a breakup with his  girlfriend of 4 years.  He has been noncompliant his medications for  least a couple of months.  He denies any substance use.  Denies any  history of hallucinations.   PAST PSYCHIATRIC HISTORY:  First admission to the [facility].  He has been hospitalized before at  [facility] for a  suicide gesture stating he had a gun in his mouth at this time.  He  states he has also tried to hang himself and was not hospitalized then.  [place]. [place]ita Chyle [place] with his  last visit approximately 2 months ago.  The patient reports being  treated for history of depression, bipolar disorder, ADHD and PTSD.   SOCIAL HISTORY:  This is a 40 year old male.  He lives in [place].  He lives with his father.  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  Denies any legal problems.   FAMILY HISTORY:  Mother has a history of major depression, anxiety  disorder.   ALCOHOL/DRUG HISTORY:  The patient smokes, but denies any alcohol or  recreational drug use.   PRIMARY CARE [place]:  None.   MEDICAL PROBLEMS:  Denies any acute or chronic health  issues.   MEDICATIONS:  He has recently been prescribed:  1. Prozac 40 mg daily.  2. Klonopin 1 mg t.i.d.  3. Seroquel 300 mg at bedtime.  4. Lithium 1200 mg daily, taking 300 in the morning, 300 in the      afternoon and 600 at bedtime.  Again, being noncompliant for      approximately 1 month.   DRUG ALLERGIES:  1. BENADRYL.  2. TRAMADOL.  3. TORADOL.   PHYSICAL EXAMINATION:  GENERAL:  This is a well-nourished male who was  fully assessed at [facility] Emergency [facility].  Physical exam was  reviewed with no significant findings.  He did receive charcoal and  Zofran for overdose.  VITAL SIGNS:  Temperature is 97.8, 60 heart rate, 16 respirations, blood  pressure is 110/68, 98% saturated.   LABORATORY DATA:  Shows urine drug screen positive for tricyclics,  salicylate less than 4, acetaminophen level less than 10.  Alcohol level  less than 5.  Lithium level less than 0.25.   MENTAL STATUS EXAM:  The patient at this time is resting in bed.  He  easily awakens and is cooperative.  He is  somewhat reserved offering  little other than questions that were asked.  His speech is soft spoken,  but done normal pace.  His mood is depressed.  Patient is flat.  Thought  processes are coherent and goal directed.  He denies any suicidal  thoughts at this time.  No delusional statements made.  Cognitive  function intact.  His memory appears intact.  Judgment and insight are  fair.   AXIS I:  Mood disorder.  AXIS II:  Deferred.  AXIS III:  No known medical conditions.  AXIS IV:  Psychosocial problems and problems with occupation.  AXIS V:  Current is 35.   PLAN:  Our plan is to clarify the patient's medications.  We will  stabilize his mood and thinking.  We will resume his Prozac 20 mg daily.  We will defer his stimulant medication at this time, but the patient to  follow up with Dr. Alita Chyle.  We will also continue his lithium and have  Neurontin available on a p.r.n. basis for  anxiety.  We will continue to  identify his support group and confirm his living arrangements.  Case  manager will obtain his  followup.  His tentative length of stay at this time is 3-5 days.      Landry Corporal, N.P.      Jasmine Pang, M.D.  Electronically Signed    JO/MEDQ  D:  03/13/2009  T:  03/13/2009  Job:  [id number]

## 2011-03-16 NOTE — Discharge Summary (Signed)
NAMEMERVIL, WACKER               ACCOUNT NO.:  [id number]   MEDICAL RECORD NO.:  [id number]          PATIENT TYPE:  INP   LOCATION:  5523                         FACILITY:  [facility]   PHYSICIAN:  Elliot Cousin, M.D.    DATE OF BIRTH:  [birth year]   DATE OF ADMISSION:  03/14/2009  DATE OF DISCHARGE:  03/17/2009                               DISCHARGE SUMMARY   DISCHARGE DIAGNOSES:  1. Lithium toxicity secondary to intentional drug overdose.  2. Suicide attempt.  3. Bipolar disorder.  4. Borderline low free T4.  5. Mild asymptomatic bradycardia.   DISCHARGE MEDICATIONS:  To be prescribed at the inpatient psychiatric  unit.   DISCHARGE DISPOSITION:  The patient is currently stable and in improved  medical condition.  The plan is to discharge him to [facility] or another inpatient psychiatric unit today or tomorrow,  depending on bed availability.   CONSULTATIONS:  Psychiatrist, Milford Cage, M.D.   PROCEDURES PERFORMED:  None.   HISTORY OF PRESENT ILLNESS:  The patient is a 40 year old man with a  past medical history significant for bipolar disorder and prior suicide  attempts who presented to the emergency department on Mar 14, 2009,  after intentionally taking a reported 25 tablets of lithium.  The  patient attempted to kill himself because he had an argument with his  girlfriend.  When he was evaluated in the emergency department, he was  found to have a lithium level of 2.92 which was in the toxic range.  His  EKG revealed normal sinus rhythm with no significant ST or T-wave  abnormalities.  His BUN was 25, slightly elevated and his creatinine was  1.27.  His electrolytes were within normal limits.  He was admitted for  further evaluation and management.   For additional details, please see the dictated history and physical.   HOSPITAL COURSE:  LITHIUM TOXICITY SECONDARY TO INTENTIONAL DRUG  OVERDOSE, SUICIDE ATTEMPT, AND BIPOLAR DISORDER.  The patient  was  started empirically on IV fluid volume repletion with normal saline.  Suicide precautions were ordered.  His cardiac status was monitored with  telemetry.  His neurological status was monitored closely as well.  Daily lithium blood levels, as well as daily electrolytes were ordered.  A number of other studies were ordered including an acetaminophen level  which was less than 10, alcohol level which was less than 5, and a urine  drug screen which was completely negative.  His blood lithium level the  following day decreased to 1.95 which was lower, however, still within  the toxic range.  Over the 24 to 48 hours following hospitalization, his  lithium blood level normalized to 0.83.  Today it is 0.51.  The patient  demonstrated no focal neurological findings because of the lithium  toxicity.  His renal function remained within normal limits.  Today his  BUN is 11 and his creatinine is 1.12.  He is mildly bradycardic with a  heart rate generally ranging in the 55 to 65 beats per minute range.  However, he has been at rest and has had no obvious  symptomatic  manifestations of the bradycardia.  I suspect that the patient has mild  chronic bradycardia as he appears to be somewhat conditioned physically.  A TSH and free T4 were ordered and his TSH was within normal limits at  0.588; however, his free T4 was borderline low at 0.79 (0.80 to 1.80  within normal range).  Treatment was not started as the patient's free  T4 was borderline low and he did have a normal TSH.  However, his  thyroid function will need to be monitored periodically throughout the  next few weeks to few months given the possibility of lithium effects on  thyroid function.   Psychiatrist, Dr. Milford Cage, was consulted.  She evaluated the  patient yesterday and recommended further treatment in an inpatient  psychiatric unit, preferably [facility].  The patient has  agreed to inpatient treatment at  [facility].   No psychotropic medications were given to the patient during this  hospitalization.  Per Dr. Michaelle Copas recommendation, he will be treated  with the appropriate medications at the [facility].  He is  currently stable.  IV fluids will be tapered off.  His appetite is well  within normal limits and he is eating without any difficulty.      Elliot Cousin, M.D.  Electronically Signed     DF/MEDQ  D:  03/17/2009  T:  03/17/2009  Job:  [id number]

## 2011-03-16 NOTE — H&P (Signed)
NAMEBREYON, [name]               ACCOUNT NO.:  [id number]   MEDICAL RECORD NO.:  [id number]          PATIENT TYPE:  INP   LOCATION:  5523                         FACILITY:  [facility]   PHYSICIAN:  Della Goo, M.D. DATE OF BIRTH:  04/20/1971   DATE OF ADMISSION:  03/14/2009  DATE OF DISCHARGE:                              HISTORY & PHYSICAL   PRIMARY CARE PHYSICIAN:  None.   CHIEF COMPLAINT:  Overdose.   HISTORY OF PRESENT ILLNESS:  This is a 40 year old male who returned to  the emergency department on Mar 14, 2009, after taking what he states  was 24 tablets of his lithium medication.  He is on lithium carbonate  300 mg twice daily and 600 mg at bedtime.  He states he took a total of  7000 mg at 3 p.m. in an attempt to kill himself.  He was seen in the  emergency department 2 days earlier on Mar 13, 2009, as well for a  similar attempt where he had taken an overdose on 9 Seroquel tablets  which for 300 mg tablets.  His precipitating event has been the breakup  of his recent relationship of 4 years.  The patient was admitted for a  psychiatric admission May 13-14 and on discharge his lithium level was  found to be 0.25 at that time.  On the emergency department evaluation,  the patient was found to have a lithium level of 2.92 which is in the  toxic range, the normal range is 0.80-1.40.  The patient was referred  for admission and placed on suicide and safety precautions.   PAST MEDICAL HISTORY:  1. Bipolar disorder.  2. Attention deficit disorder.  3. Prior suicide attempts.  4. Depression.   MEDICATIONS:  1. Seroquel 300 mg p.o. q.h.s.  2. Klonopin 1 mg p.o. t.i.d.  3. Lithium carbonate 300 mg p.o. b.i.d.  4. Lithium carbonate 600 mg p.o. q.h.s.  5. One other medication.   PAST SURGICAL HISTORY:  None.   ALLERGIES:  BENADRYL, TORADOL, and TRAMADOL all causing rash.   SOCIAL HISTORY:  The patient is a smoker.  He smokes a half-pack of  cigarettes since age 30.  He  reports not drinking alcohol recently.   FAMILY HISTORY:  Positive for diabetes and hypertension in his mother.  Negative for coronary artery disease and cancer.   REVIEW OF SYSTEMS:  Negative except for depression.   PHYSICAL EXAMINATION:  FINDINGS:  This is a 40 year old well-nourished,  well-developed male in no visible discomfort or acute distress.  VITAL SIGNS: Temperature 98.8, blood pressure 128/74, heart rate 87,  respirations 16, O2 saturation is 97%.  HEENT: Examination normocephalic, atraumatic.  There is no scleral  icterus.  Pupils are equally round, reactive to light.  Extraocular  movements are intact.  Funduscopic benign.  Oropharynx is clear.  NECK:  Supple, full range of motion.  No thyromegaly, adenopathy,  jugular venous distention.  CARDIOVASCULAR: Regular rate and rhythm.  No murmurs, gallops or rubs.  LUNGS:  Clear to auscultation bilaterally.  ABDOMEN: Positive bowel sounds, soft, nontender, nondistended.  EXTREMITIES: Without cyanosis, clubbing  or edema.  NEUROLOGIC EXAMINATION:  The patient is alert and oriented x3.  Cranial  nerves are intact.  Motor and sensory function also intact.  There are  no focal deficits on examination.   LABORATORY STUDIES:  White blood cell count 7.3, hemoglobin 15.2,  hematocrit 43.9, MCV 92.5, platelets 209.  Sodium 138, potassium 4.5,  chloride 106, CO2 28, BUN 29, creatinine 1.4, glucose 94.  Urinalysis  negative.  Lithium level initially at 1740 hours was 2.17;  at 2128  hours, the lithium level was 2.92.  Then at 2342 hours, the lithium  level was 2.24 and at 0048 hours, the lithium level was 1.95.  Urine  drug screen negative.  Alcohol level less than 5.  Acetaminophen level  less than 10.0.   ASSESSMENT:  A 40 year old male being admitted with:  1. Lithium toxicity.  2. Lithium overdose.  3. Suicide attempt.  4. Depression.  5. Bipolar disorder history.   PLAN:  The patient will be admitted to telemetry area for  cardiac  monitoring.  The patient will be placed on IV fluids for rehydration  therapy.  His lithium levels will be monitored as well.  His lithium  will also be held for now.  The patient will be placed on suicide/safety  precautions and a one-to-one sitter has been requested.  The patient  will be reevaluated by [facility] psychiatry.  The patient will  be placed on DVT and GI prophylaxis in the meantime and further workup  will ensue pending results of his clinical course.      Della Goo, M.D.  Electronically Signed     HJ/MEDQ  D:  03/15/2009  T:  03/15/2009  Job:  [id number]
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
## 2011-04-23 ENCOUNTER — Inpatient Hospital Stay (HOSPITAL_COMMUNITY)
Admission: EM | Admit: 2011-04-23 | Discharge: 2011-04-29 | DRG: 918 | Disposition: A | Discharge status: Home / Self Care | Payer: PRIVATE HEALTH INSURANCE | Source: Other Acute Inpatient Hospital | Attending: Internal Medicine | Admitting: Internal Medicine

## 2011-04-23 ENCOUNTER — Emergency Department (HOSPITAL_COMMUNITY)
Admission: EM | Admit: 2011-04-23 | Discharge: 2011-04-23 | Disposition: A | Discharge status: Other Acute Inpatient Hospital | Payer: PRIVATE HEALTH INSURANCE | Source: Home / Self Care | Attending: Emergency Medicine | Admitting: Emergency Medicine

## 2011-04-23 DIAGNOSIS — T438X4A Poisoning by other psychotropic drugs, undetermined, initial encounter: Secondary | ICD-10-CM

## 2011-04-23 DIAGNOSIS — T438X1A Poisoning by other psychotropic drugs, accidental (unintentional), initial encounter: Principal | ICD-10-CM | POA: Diagnosis present

## 2011-04-23 DIAGNOSIS — F319 Bipolar disorder, unspecified: Secondary | ICD-10-CM | POA: Diagnosis present

## 2011-04-23 DIAGNOSIS — F4310 Post-traumatic stress disorder, unspecified: Secondary | ICD-10-CM | POA: Diagnosis present

## 2011-04-23 DIAGNOSIS — F988 Other specified behavioral and emotional disorders with onset usually occurring in childhood and adolescence: Secondary | ICD-10-CM | POA: Diagnosis present

## 2011-04-23 DIAGNOSIS — T43502A Poisoning by unspecified antipsychotics and neuroleptics, intentional self-harm, initial encounter: Secondary | ICD-10-CM

## 2011-04-23 DIAGNOSIS — R339 Retention of urine, unspecified: Secondary | ICD-10-CM | POA: Diagnosis present

## 2011-04-23 DIAGNOSIS — M1990 Unspecified osteoarthritis, unspecified site: Secondary | ICD-10-CM | POA: Diagnosis present

## 2011-04-23 DIAGNOSIS — T438X2A Poisoning by other psychotropic drugs, intentional self-harm, initial encounter: Secondary | ICD-10-CM

## 2011-04-23 DIAGNOSIS — K5900 Constipation, unspecified: Secondary | ICD-10-CM | POA: Diagnosis present

## 2011-04-23 DIAGNOSIS — IMO0002 Reserved for concepts with insufficient information to code with codable children: Secondary | ICD-10-CM | POA: Diagnosis present

## 2011-04-23 DIAGNOSIS — F1910 Other psychoactive substance abuse, uncomplicated: Secondary | ICD-10-CM | POA: Diagnosis present

## 2011-04-23 DIAGNOSIS — F17200 Nicotine dependence, unspecified, uncomplicated: Secondary | ICD-10-CM | POA: Diagnosis present

## 2011-04-23 DIAGNOSIS — F411 Generalized anxiety disorder: Secondary | ICD-10-CM | POA: Diagnosis present

## 2011-04-23 DIAGNOSIS — R569 Unspecified convulsions: Secondary | ICD-10-CM | POA: Diagnosis present

## 2011-04-23 LAB — COMPREHENSIVE METABOLIC PANEL
AST: 20 U/L (ref 0–37)
Albumin: 4.8 g/dL (ref 3.5–5.2)
Alkaline Phosphatase: 55 U/L (ref 39–117)
Chloride: 97 mEq/L (ref 96–112)
Potassium: 4 mEq/L (ref 3.5–5.1)
Total Bilirubin: 0.2 mg/dL — ABNORMAL LOW (ref 0.3–1.2)

## 2011-04-23 LAB — DIFFERENTIAL
Basophils Absolute: 0.1 10*3/uL (ref 0.0–0.1)
Basophils Relative: 1 % (ref 0–1)
Eosinophils Absolute: 0.1 10*3/uL (ref 0.0–0.7)
Eosinophils Relative: 1 % (ref 0–5)
Monocytes Absolute: 0.8 10*3/uL (ref 0.1–1.0)
Neutro Abs: 6.5 10*3/uL (ref 1.7–7.7)

## 2011-04-23 LAB — URINALYSIS, ROUTINE W REFLEX MICROSCOPIC
Leukocytes, UA: NEGATIVE
Protein, ur: NEGATIVE mg/dL
Urobilinogen, UA: 0.2 mg/dL (ref 0.0–1.0)

## 2011-04-23 LAB — RAPID URINE DRUG SCREEN, HOSP PERFORMED
Amphetamines: NOT DETECTED
Barbiturates: NOT DETECTED
Benzodiazepines: NOT DETECTED
Cocaine: NOT DETECTED

## 2011-04-23 LAB — SALICYLATE LEVEL: Salicylate Lvl: <2 mg/dL — ABNORMAL LOW (ref 2.8–20.0)

## 2011-04-23 LAB — ACETAMINOPHEN LEVEL: Acetaminophen (Tylenol), Serum: <15 ug/mL (ref 10–30)

## 2011-04-23 LAB — LITHIUM LEVEL: Lithium Lvl: 2.05 mEq/L (ref 0.80–1.40)

## 2011-04-23 LAB — CBC
MCHC: 33.4 g/dL (ref 30.0–36.0)
Platelets: 325 10*3/uL (ref 150–400)
RDW: 13.6 % (ref 11.5–15.5)

## 2011-04-23 LAB — GLUCOSE, CAPILLARY: Glucose-Capillary: 106 mg/dL — ABNORMAL HIGH (ref 70–99)

## 2011-04-24 DIAGNOSIS — G40309 Generalized idiopathic epilepsy and epileptic syndromes, not intractable, without status epilepticus: Secondary | ICD-10-CM

## 2011-04-24 DIAGNOSIS — T6592XA Toxic effect of unspecified substance, intentional self-harm, initial encounter: Secondary | ICD-10-CM

## 2011-04-24 DIAGNOSIS — R4182 Altered mental status, unspecified: Secondary | ICD-10-CM

## 2011-04-24 DIAGNOSIS — T798XXA Other early complications of trauma, initial encounter: Secondary | ICD-10-CM

## 2011-04-24 LAB — BASIC METABOLIC PANEL
BUN: 8 mg/dL (ref 6–23)
BUN: 8 mg/dL (ref 6–23)
BUN: 9 mg/dL (ref 6–23)
CO2: 27 mEq/L (ref 19–32)
Calcium: 9.2 mg/dL (ref 8.4–10.5)
Calcium: 8.9 mg/dL (ref 8.4–10.5)
Calcium: 8.8 mg/dL (ref 8.4–10.5)
Chloride: 107 mEq/L (ref 96–112)
Chloride: 105 mEq/L (ref 96–112)
Chloride: 105 mEq/L (ref 96–112)
Chloride: 104 mEq/L (ref 96–112)
Creatinine, Ser: 1.08 mg/dL (ref 0.50–1.35)
Creatinine, Ser: 1.14 mg/dL (ref 0.50–1.35)
GFR calc Af Amer: >60 mL/min (ref >60)
GFR calc Af Amer: >60 mL/min (ref >60)
GFR calc Af Amer: >60 mL/min (ref >60)
GFR calc Af Amer: >60 mL/min (ref >60)
GFR calc Af Amer: >60 mL/min (ref >60)
GFR calc non Af Amer: >60 mL/min (ref >60)
GFR calc non Af Amer: >60 mL/min (ref >60)
GFR calc non Af Amer: >60 mL/min (ref >60)
GFR calc non Af Amer: >60 mL/min (ref >60)
Potassium: 4.3 mEq/L (ref 3.5–5.1)
Potassium: 4 mEq/L (ref 3.5–5.1)
Potassium: 4.2 mEq/L (ref 3.5–5.1)
Potassium: 3.9 mEq/L (ref 3.5–5.1)
Potassium: 3.2 mEq/L — ABNORMAL LOW (ref 3.5–5.1)
Sodium: 140 mEq/L (ref 135–145)
Sodium: 141 mEq/L (ref 135–145)
Sodium: 139 mEq/L (ref 135–145)
Sodium: 138 mEq/L (ref 135–145)

## 2011-04-24 LAB — BLOOD GAS, ARTERIAL
Bicarbonate: 24.9 mEq/L — ABNORMAL HIGH (ref 20.0–24.0)
Patient temperature: 98.6
TCO2: 26.1 mmol/L (ref 0–100)
pH, Arterial: 7.432 (ref 7.350–7.450)
pO2, Arterial: 110 mmHg — ABNORMAL HIGH (ref 80.0–100.0)

## 2011-04-24 LAB — GLUCOSE, CAPILLARY
Glucose-Capillary: 81 mg/dL (ref 70–99)
Glucose-Capillary: 100 mg/dL — ABNORMAL HIGH (ref 70–99)
Glucose-Capillary: 110 mg/dL — ABNORMAL HIGH (ref 70–99)
Glucose-Capillary: 127 mg/dL — ABNORMAL HIGH (ref 70–99)

## 2011-04-24 LAB — MAGNESIUM: Magnesium: 1.9 mg/dL (ref 1.5–2.5)

## 2011-04-24 LAB — CBC
MCH: 30.8 pg (ref 26.0–34.0)
MCHC: 34.3 g/dL (ref 30.0–36.0)
Platelets: 291 10*3/uL (ref 150–400)
RDW: 13.8 % (ref 11.5–15.5)

## 2011-04-24 LAB — LITHIUM LEVEL
Lithium Lvl: 1.4 mEq/L (ref 0.80–1.40)
Lithium Lvl: 0.99 mEq/L (ref 0.80–1.40)

## 2011-04-24 LAB — PHOSPHORUS: Phosphorus: 2.7 mg/dL (ref 2.3–4.6)

## 2011-04-25 ENCOUNTER — Inpatient Hospital Stay (HOSPITAL_COMMUNITY): Payer: PRIVATE HEALTH INSURANCE

## 2011-04-25 DIAGNOSIS — F319 Bipolar disorder, unspecified: Secondary | ICD-10-CM

## 2011-04-25 LAB — BASIC METABOLIC PANEL
BUN: 8 mg/dL (ref 6–23)
BUN: 6 mg/dL (ref 6–23)
BUN: 7 mg/dL (ref 6–23)
CO2: 24 mEq/L (ref 19–32)
CO2: 25 mEq/L (ref 19–32)
Calcium: 8.5 mg/dL (ref 8.4–10.5)
Calcium: 8.6 mg/dL (ref 8.4–10.5)
Calcium: 8.6 mg/dL (ref 8.4–10.5)
Calcium: 8.7 mg/dL (ref 8.4–10.5)
Calcium: 8.5 mg/dL (ref 8.4–10.5)
Chloride: 105 mEq/L (ref 96–112)
Creatinine, Ser: 1.01 mg/dL (ref 0.50–1.35)
Creatinine, Ser: 1.04 mg/dL (ref 0.50–1.35)
Creatinine, Ser: 0.94 mg/dL (ref 0.50–1.35)
Creatinine, Ser: 1.05 mg/dL (ref 0.50–1.35)
GFR calc Af Amer: >60 mL/min (ref >60)
GFR calc Af Amer: >60 mL/min (ref >60)
GFR calc non Af Amer: >60 mL/min (ref >60)
GFR calc non Af Amer: >60 mL/min (ref >60)
GFR calc non Af Amer: >60 mL/min (ref >60)
GFR calc non Af Amer: >60 mL/min (ref >60)
GFR calc non Af Amer: >60 mL/min (ref >60)
Glucose, Bld: 94 mg/dL (ref 70–99)
Glucose, Bld: 109 mg/dL — ABNORMAL HIGH (ref 70–99)
Glucose, Bld: 147 mg/dL — ABNORMAL HIGH (ref 70–99)
Glucose, Bld: 126 mg/dL — ABNORMAL HIGH (ref 70–99)
Potassium: 3.8 mEq/L (ref 3.5–5.1)
Potassium: 3.8 mEq/L (ref 3.5–5.1)
Sodium: 138 mEq/L (ref 135–145)
Sodium: 139 mEq/L (ref 135–145)
Sodium: 138 mEq/L (ref 135–145)
Sodium: 136 mEq/L (ref 135–145)
Sodium: 139 mEq/L (ref 135–145)
Sodium: 138 mEq/L (ref 135–145)

## 2011-04-25 LAB — GLUCOSE, CAPILLARY
Glucose-Capillary: 89 mg/dL (ref 70–99)
Glucose-Capillary: 95 mg/dL (ref 70–99)

## 2011-04-25 LAB — LITHIUM LEVEL: Lithium Lvl: 0.6 mEq/L — ABNORMAL LOW (ref 0.80–1.40)

## 2011-04-26 ENCOUNTER — Inpatient Hospital Stay (HOSPITAL_COMMUNITY): Payer: PRIVATE HEALTH INSURANCE

## 2011-04-26 LAB — BASIC METABOLIC PANEL
BUN: 7 mg/dL (ref 6–23)
Calcium: 8.7 mg/dL (ref 8.4–10.5)
Creatinine, Ser: 0.98 mg/dL (ref 0.50–1.35)
GFR calc Af Amer: >60 mL/min (ref >60)
GFR calc non Af Amer: >60 mL/min (ref >60)
Glucose, Bld: 112 mg/dL — ABNORMAL HIGH (ref 70–99)

## 2011-04-26 MED ORDER — GADOBENATE DIMEGLUMINE 529 MG/ML IV SOLN: 18.0000 mL | Freq: Once | INTRAVENOUS | Status: DC

## 2011-04-26 NOTE — Consult Note (Signed)
[name], [name]               ACCOUNT NO.:  [id number]  MEDICAL RECORD NO.:  [id number]  LOCATION:  2105                         FACILITY:  [facility]  PHYSICIAN:  Sherylann Vangorden T. Daryel Kenneth, M.D.   DATE OF BIRTH:  06/18/1971  DATE OF CONSULTATION: 04/25/11                                 CONSULTATION   The patient is a 40 year old Caucasian male who is admitted to the medical floor status post lithium toxicity and overdose.  His lithium level was 2.05.  The patient told that he is sick and tired with his life and does not want to live anymore.  On medical floor, the patient also developed seizure which is not new onset, and he is scheduled to have EEG and MRI.  The patient has a long history of psychiatric illness, been diagnosed in the past with bipolar disorder, ADD, depressive disorder, abusing opiates and benzos.  His last admission to [facility] was in April when he overdosed on lithium.  The patient told that he is sick and tired with life in general.  His mother has been sick and recently his stepfather is not treating his mother very well.  The patient is currently living with his friend for past 8 months, and this year he had it at least 3 times for almost same circumstances.  The patient told that he has been not going to his work for past few days, has been feeling more lonely, depressed and sad.  He has decreased energy and decreased concentration, though he denies using any illegal substance or any alcohol.  PAST PSYCHIATRIC HISTORY:  As mentioned above.  The patient has numerous psychiatric hospital admissions for severe depression, suicidal attempt, and worsening of his bipolar illness.  In the past, he has been overdosed on Seroquel, Klonopin, and lithium.  He has been tried on Risperdal, Klonopin, Adderall, Seroquel, Remeron, Prozac, and lithium.  FAMILY HISTORY:  He denies any family history of psychiatric illness.  SOCIAL HISTORY:  The patient lives with his  friend.  He is close to his mother who he has talked 4 weeks ago.  The patient does not get along with his stepfather.  He has been working in a company who [employer].  However, he has been not going to the work for the past few days.  MEDICAL PROBLEMS:  The patient has a history of degenerative disk disease, recently overdosed on lithium, and new onset of seizures.  DRUG ALLERGIES:  The patient is allergic to TORADOL, BENADRYL, and ASPIRIN.  MENTAL STATUS EXAMINATION:  The patient is alert and oriented x3.  He is superficially cooperative and maintains fair eye contact.  He continued to endorse depressed mood and hopeless and worthless feeling, though he denies any auditory hallucinations or homicidal thoughts, but he continued to have suicidal ideation.  He is guarded at times and somewhat manipulative.  There was no psychosis present.  He is alert and oriented x3.  His insight and judgment is poor.  His impulse control is okay.  DIAGNOSES:  AXIS I:  Bipolar disorder, benzodiazepine abuse. AXIS II:  Deferred. AXIS III:  See medical history. AXIS IV:  Moderate. AXIS V:  30-35.  PLAN:  The patient has been getting his lithium level checked on a regular basis.  His last level was 0.6, though he is out of his toxic range, but he is still having new onset of seizures, the etiology at this time is unclear.  The patient will require sitter due to his unpredictable behavior and continued suicidal thinking.  He will require inpatient services.  However, since the patient has been admitted to [facility] many times, I will consider to refer to [facility].  This patient does need more long-term care.  If the patient stays on the medical floor, please consult Liaison Service for followup.  This patient will require inpatient treatment.     [name], M.D.     STA/MEDQ  D:  04/25/2011  T:  04/26/2011  Job:  [id number]  Electronically Signed by  Kathryne Sharper M.D. on 04/26/2011 09:34:02 AM

## 2011-04-27 ENCOUNTER — Inpatient Hospital Stay (HOSPITAL_COMMUNITY): Payer: PRIVATE HEALTH INSURANCE

## 2011-04-27 DIAGNOSIS — F319 Bipolar disorder, unspecified: Secondary | ICD-10-CM

## 2011-04-27 LAB — BASIC METABOLIC PANEL
BUN: 7 mg/dL (ref 6–23)
CO2: 26 mEq/L (ref 19–32)
Calcium: 8.9 mg/dL (ref 8.4–10.5)
Chloride: 102 mEq/L (ref 96–112)
Creatinine, Ser: 0.95 mg/dL (ref 0.50–1.35)
GFR calc Af Amer: >60 mL/min (ref >60)
GFR calc non Af Amer: >60 mL/min (ref >60)
Glucose, Bld: 97 mg/dL (ref 70–99)
Potassium: 3.8 mEq/L (ref 3.5–5.1)
Sodium: 137 mEq/L (ref 135–145)

## 2011-04-27 LAB — URINALYSIS, ROUTINE W REFLEX MICROSCOPIC
Bilirubin Urine: NEGATIVE
Glucose, UA: NEGATIVE mg/dL
Hgb urine dipstick: NEGATIVE
Ketones, ur: NEGATIVE mg/dL
Leukocytes, UA: NEGATIVE
Nitrite: NEGATIVE
Protein, ur: NEGATIVE mg/dL
Specific Gravity, Urine: 1.012 (ref 1.005–1.030)
Urobilinogen, UA: 0.2 mg/dL (ref 0.0–1.0)
pH: 7 (ref 5.0–8.0)

## 2011-04-27 NOTE — Procedures (Unsigned)
EEG NUMBER:  HISTORY:  A 40 year old male status post suicide attempt with seizure.  MEDICATIONS:  Heparin, thiamine, vitamins, and Percocet.  CONDITIONS OF RECORDING:  This is a 16-channel EEG carried out with the patient in the awake and drowsy states.  DESCRIPTION:  The waking background activity consists of a low-voltage, symmetrical, fairly well-organized 5 Hz alpha activity seen from the parieto-occipital and posterior temporal regions.  Low-voltage, fast activity, poorly organized was seen anteriorly and at times superimposed on more posterior rhythms.  A mixture of theta and alpha rhythm was seen from the central and temporal regions.  The patient drowses with slowing to irregular, voltage is theta and beta activity.  Stage II sleep was not obtained.  Hypoventilation was performed and produced a significant buildup.  No abnormalities were noted.  Intermittent photic stimulation was performed and elicited symmetrical driving response.  No abnormalities were noted.  IMPRESSION:  This is a normal EEG.          ______________________________ Thana Farr, MD    ZO:[id number] D:  04/26/2011 18:19:37  T:  04/27/2011 02:40:31  Job #:  [id number]

## 2011-04-28 DIAGNOSIS — F319 Bipolar disorder, unspecified: Secondary | ICD-10-CM

## 2011-04-28 LAB — BASIC METABOLIC PANEL
BUN: 8 mg/dL (ref 6–23)
Calcium: 9.2 mg/dL (ref 8.4–10.5)
Creatinine, Ser: 1.1 mg/dL (ref 0.50–1.35)
GFR calc Af Amer: >60 mL/min (ref >60)
GFR calc non Af Amer: >60 mL/min (ref >60)

## 2011-04-28 LAB — CBC
HCT: 38.3 % — ABNORMAL LOW (ref 39.0–52.0)
MCHC: 33.4 g/dL (ref 30.0–36.0)
MCV: 88.7 fL (ref 78.0–100.0)
RDW: 13.8 % (ref 11.5–15.5)

## 2011-04-28 LAB — HIV ANTIBODY (ROUTINE TESTING W REFLEX): HIV: NONREACTIVE

## 2011-04-30 ENCOUNTER — Inpatient Hospital Stay: Payer: Self-pay | Admitting: Psychiatry

## 2011-05-01 NOTE — Discharge Summary (Signed)
  NAME:  [name], [name]               ACCOUNT NO.:  [id number]  MEDICAL RECORD NO.:  [id number]  LOCATION:                                 FACILITY:  PHYSICIAN:  Lonia Blood, M.D.       DATE OF BIRTH:  November 01, 1971  DATE OF ADMISSION: DATE OF DISCHARGE:                              DISCHARGE SUMMARY   ADDENDUM  In addition to his other discharge medications on the previous discharge summary, Mr. Siebenaler will be discharged with a prescription 4-5 days worth of oxycodone 5 mg q.6 h. p.r.n. pain.     Stephani Police, PA   ______________________________ Lonia Blood, M.D.    MLY/MEDQ  D:  04/29/2011  T:  04/30/2011  Job:  [id number]  Electronically Signed by Algis Downs PA on 04/30/2011 12:51:36 PM Electronically Signed by Lonia Blood M.D. on 05/01/2011 04:46:37 PM

## 2011-05-01 NOTE — Discharge Summary (Signed)
NAMEBEHR, CISLO               ACCOUNT NO.:  [id number]  MEDICAL RECORD NO.:  [id number]  LOCATION:  5503                         FACILITY:  [facility]  PHYSICIAN:  Lonia Blood, M.D.       DATE OF BIRTH:  [birth year]  DATE OF ADMISSION:  04/23/2011 DATE OF DISCHARGE:  04/29/2011                              DISCHARGE SUMMARY   Mr. Stracke has no primary care physician at this point.  His Psychiatry care will be provided by [facility].  DISCHARGE DIAGNOSES: 1. Suicide attempt with lithium overdose. 2. New onset seizure. 3. Urinary retention. 4. Constipation.  The rest of his diagnoses are consistent with his     past medical history. 5. Bipolar disorder. 6. Attention deficit disorder. 7. Depressive disorder. 8. Degenerative disk disease. 9. History of prior prescription drug abuse. 10.History of prior suicide attempt.  CONDITION AT THE TIME OF DISCHARGE:  The patient is alert and oriented. He is pleasant, cooperative.  He is eating well, urinating well, able to be up and walk around the room.  He appears to be thinking clearly. States he is ready for discharge to home.  He currently does not have a primary care physician and would like to arrange for one.  He knows to follow up at [facility].  However, he first see Dr. Dub Mikes.  HISTORY AND BRIEF HOSPITAL COURSE:  Mr. Cordova is a 40 year old male with a prior history listed above.  He was admitted to [facility] after overdosing on lithium.  His lithium level was 2.05.  He had fortunately vomited after taking it.  On the medical floor, the patient developed a seizure, tonic-clonic in nature.  Neurology was called and ordered an MRI and EEG.  These were negative.  Neurology felt that because they were negative, no antiepileptic drug needed to be started.    The patient was seen in consultation by Dr. Kathryne Sharper of Psychiatry, who diagnosed the patient with bipolar  disorder and benzodiazepine abuse.  He felt that the patient had a poor insight and judgment and his impulse control was okay.  Dr. Lolly Mustache felt that a sitter was required due to his unpredictable behavior and suicidal thinking.  He also felt that the patient would require Inpatient Behavioral Health Services.  Over the next several days, the patient improved markedly.  We were able to discontinue his sitter.  He was seen in follow-up by Dr. Rogers Blocker of the Psychiatric Services.  Risperdal was started.  The patient did very well and Dr. Rogers Blocker thought that no Inpatient Behavioral Health Service would be necessary, but that he would require outpatient psychiatric follow-up.    The patient on day two of his hospitalization developed urinary retention.  A Foley had to be placed.  It was in place for 3 days.  Once it was removed, the patient was able to urinate without difficulty.  Also, the patient during his hospital stay developed constipation and required milk of magnesia as well as an enema and multiple stool softeners.  Today, he is doing well.  CONSULTATIONS: 1. The patient was seen in consultation by Dr. Thana Farr with  neurology on April 25, 2011. 2. The patient was seen in consultation by Dr. Kathryne Sharper of     Psychiatry on April 26, 2011.  PERTINENT LABS:  Initially, the patient's white count on admission was 12.5, hemoglobin 13.6, hematocrit 39.6, platelets 291.  Lithium level was 2.05.  Sodium 139, potassium 4.2, chloride 106, bicarb 28, glucose 92, BUN 8, creatinine 1.07.  His lithium level was followed until June 26 when it was 0.27.  Lithium was not restarted because the patient had urinary retention.  A UA was checked and found to be without any infection.  Also, GC probe was negative and Chlamydia via urine was negative.  RADIOLOGICAL EXAM: 1. The patient had a renal ultrasound secondary to urinary retention.     This was found to be normal bilateral renal  ultrasound. 2. He had an MRI of his brain with and without contrast.  He had a     negative cranial MRI. 3. He had CT of his head without contrast that showed normal     noncontrast CT appearance of the brain.  DISCHARGE MEDICATIONS: 1. Docusate sodium 100 mg by mouth daily at bedtime for constipation. 2. Over-the-counter multivitamin 1 tablet daily by mouth. 3. Risperidone 1 mg by mouth twice daily, he will be given a     prescription for 30 days. 4. Tylenol Extra Strength 1 tablet by mouth three times a day as     needed for pain.   Mr. Hetz will be discharged with a prescription 4-5 days worth of oxycodone 5 mg q.6 h. p.r.n. pain.  DISCHARGE INSTRUCTIONS:  The patient will be discharged to home. Activity level has no restrictions.  DIET:  Heart healthy regular diet.  FOLLOWUP APPOINTMENT: 1. We have requested that the patient have primary care follow-up     scheduled before he leaves the hospital. 2. He is to go to [facility] for a     walk-in appointment today or tomorrow, telephone number [phone]     for ongoing psychiatric care.     Stephani Police, PA   ______________________________ Lonia Blood, M.D.    MLY/MEDQ  D:  04/29/2011  T:  04/29/2011  Job:  [id number]  cc:   [facility] Geoffery Lyons, M.D.  Electronically Signed by Algis Downs PA on 04/30/2011 12:57:09 PM Electronically Signed by Lonia Blood M.D. on 05/01/2011 04:46:31 PM

## 2011-05-08 NOTE — Consult Note (Signed)
  [name], [name]               ACCOUNT NO.:  [id number]  MEDICAL RECORD NO.:  [id number]  LOCATION:  2105                         FACILITY:  [facility]  PHYSICIAN:  Thana Farr, MD    DATE OF BIRTH:  11-Jun-1971  DATE OF CONSULTATION:  04/25/2011 DATE OF DISCHARGE:                                CONSULTATION   CHIEF COMPLAINT:  Witnessed seizure - tonic-clonic type, status post lithium overdose.  HISTORY OF PRESENT ILLNESS:  This is a 40 year old white male with a strong psychiatric history of bipolar disorder and multiple suicide attempts including prior lithium overdoses.  He was recently detoxed for polysubstance abuse and was brought to the emergency room on April 23, 2011 after admitting to lithium overdose.  Lab at that time showed a lithium level of 2.05.  Lithium has been held and repeat level today, June 24, 201,2 is 0.68.  Yesterday, around 1500, he had a witnessed tonic-clonic type of seizure by the nursing staff.  Neurology consult called.  PAST HISTORY:  Significant for bipolar disorder, ADD, DJD, and a history of multiple suicide attempts.  ALLERGIES: 1. KETOROLAC. 2. TRAMADOL. 3. ABILIFY. 4. HYDROXYZINE. 5. ASPIRIN.  SOCIAL HISTORY:  The patient has a history of illicit drug use/polysubstance abuse.  REVIEW OF SYSTEMS:  See HPI.  Otherwise unremarkable.  PHYSICAL EXAMINATION:  VITAL SIGNS:  Blood pressure is 113/75, pulse 70, respirations 15, temperature is 98.1.  The patient is alert and oriented x3 and carries out 2-step commands. NEURO: PERRL.  Conjugate gaze.  EOMI.  Visual fields intact, face is symmetrical, tongue midline.  Shoulder shrug and head turn is with normal limits.  Motor is 5/5 in upper and lower extremities bilaterally, RAMI.  DTRs 2+ throughout with downgoing toes bilaterally.  Drift negative.  He has a tremor of both the arms in extension.  Sensation is intact to light touch throughout.  Finger-to-nose, heel-to-shin is within  normal limits.  LABORATORY DATA:  Sodium 138, potassium 3.7, BUN 7, creatinine 1.01, glucose 94.  White cells 12.5, hemoglobin 13.6, platelet 291.  Magnesium 1.9, phosphorus 2.7, TSH 1.077.  Lithium level trend 2.05, 1.40, 0.99, 0.60.  ASSESSMENT:  This is a 40 year old white male with bipolar disorder and no prior history of seizure, who had an intentional lithium overdose. He had a witnessed seizure at 1500 on April 24, 2011.  ASSESSMENT:  Seizure - check EEG and CT.  If unremarkable, we will order an MRI.  If EEG and imaging are unremarkable, would not start AEDs at this time.  The patient has been seen and examined by Dr. Thana Farr.     [name], P.A.   ______________________________ Thana Farr, MD    TCJ/MEDQ  D:  04/25/2011  T:  04/26/2011  Job:  [id number]  Electronically Signed by Delice Bison JERNEJCIC P.A. on 05/07/2011 07:38:46 AM Electronically Signed by Thana Farr MD on 05/08/2011 05:18:41 PM

## 2011-05-27 IMAGING — CR DG FOOT COMPLETE 3+V*R*
3 series · 3 of 3 positions shown · non-contrast
Comparison: 04/01/2009

CLINICAL DATA: Right foot pain.  Dog bite.

RIGHT FOOT COMPLETE - 3+ VIEW

[t foot ap right]
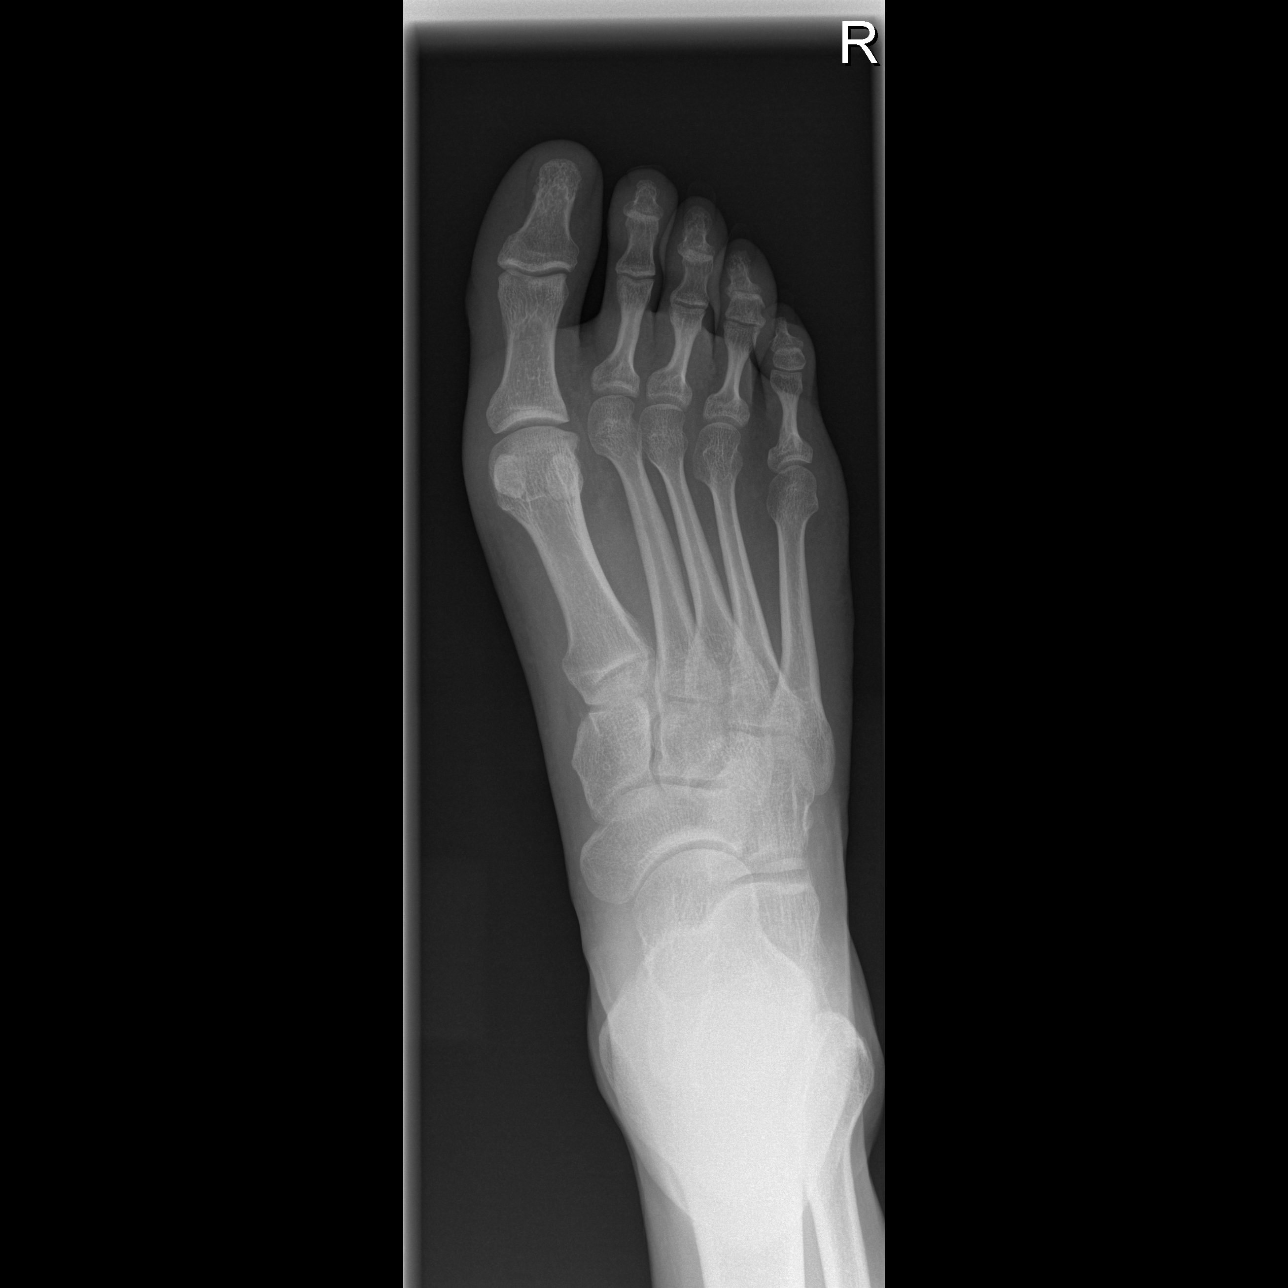

[t foot oblique right]
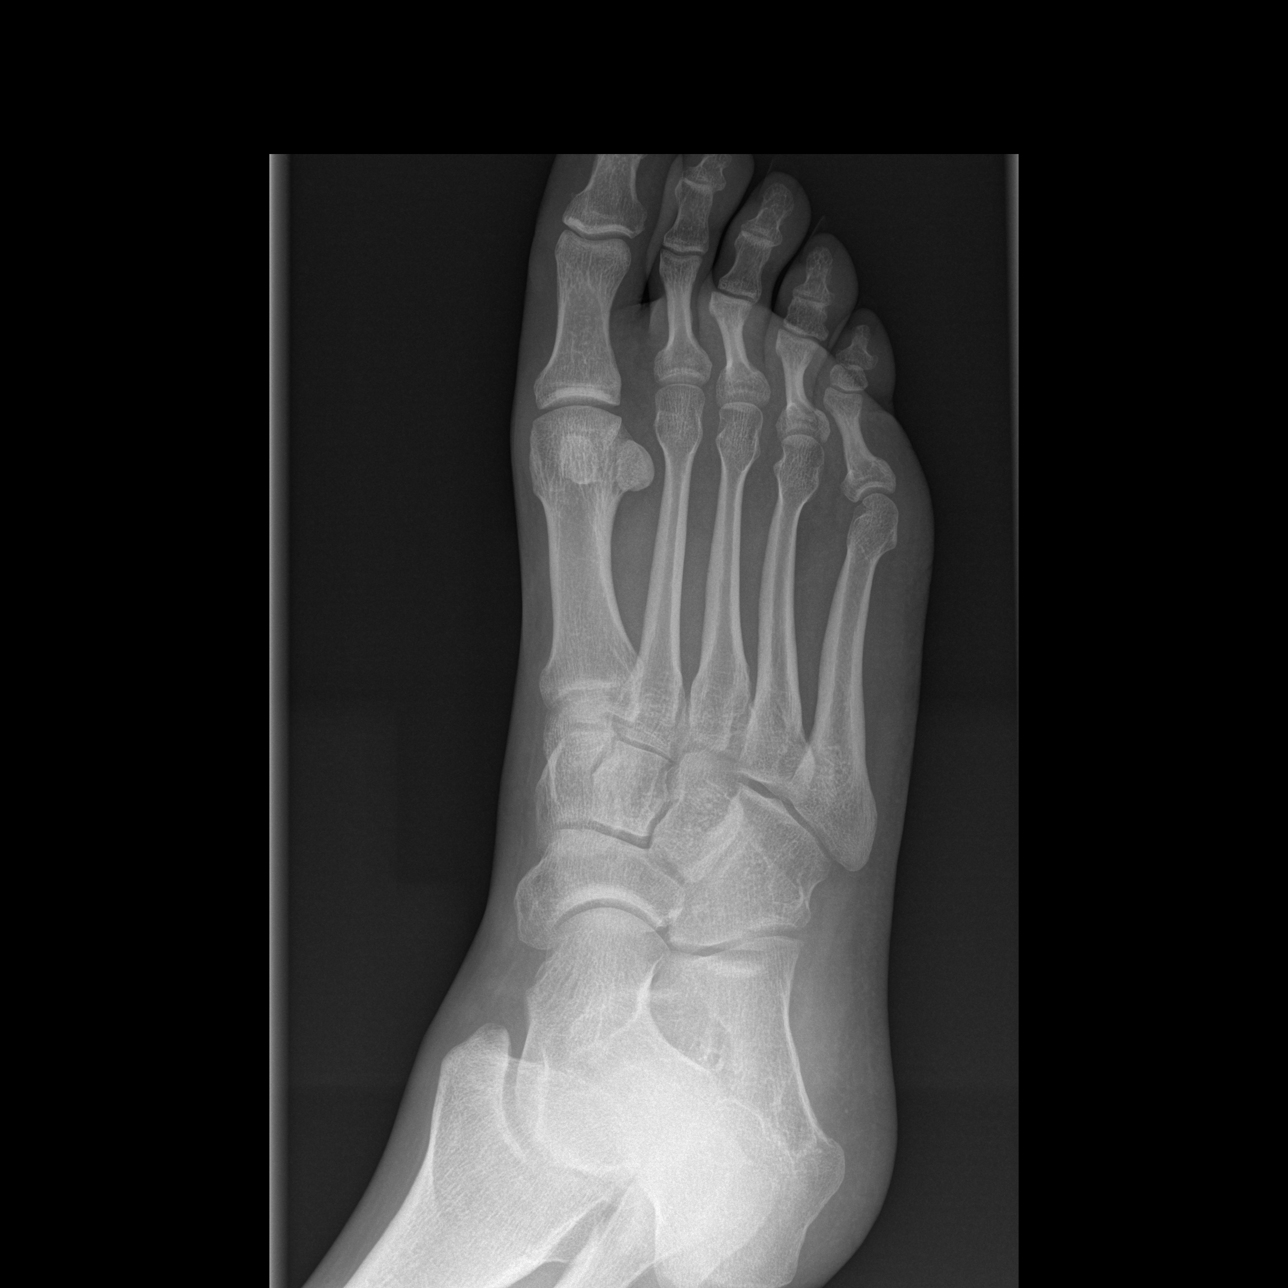

[t foot lat right]
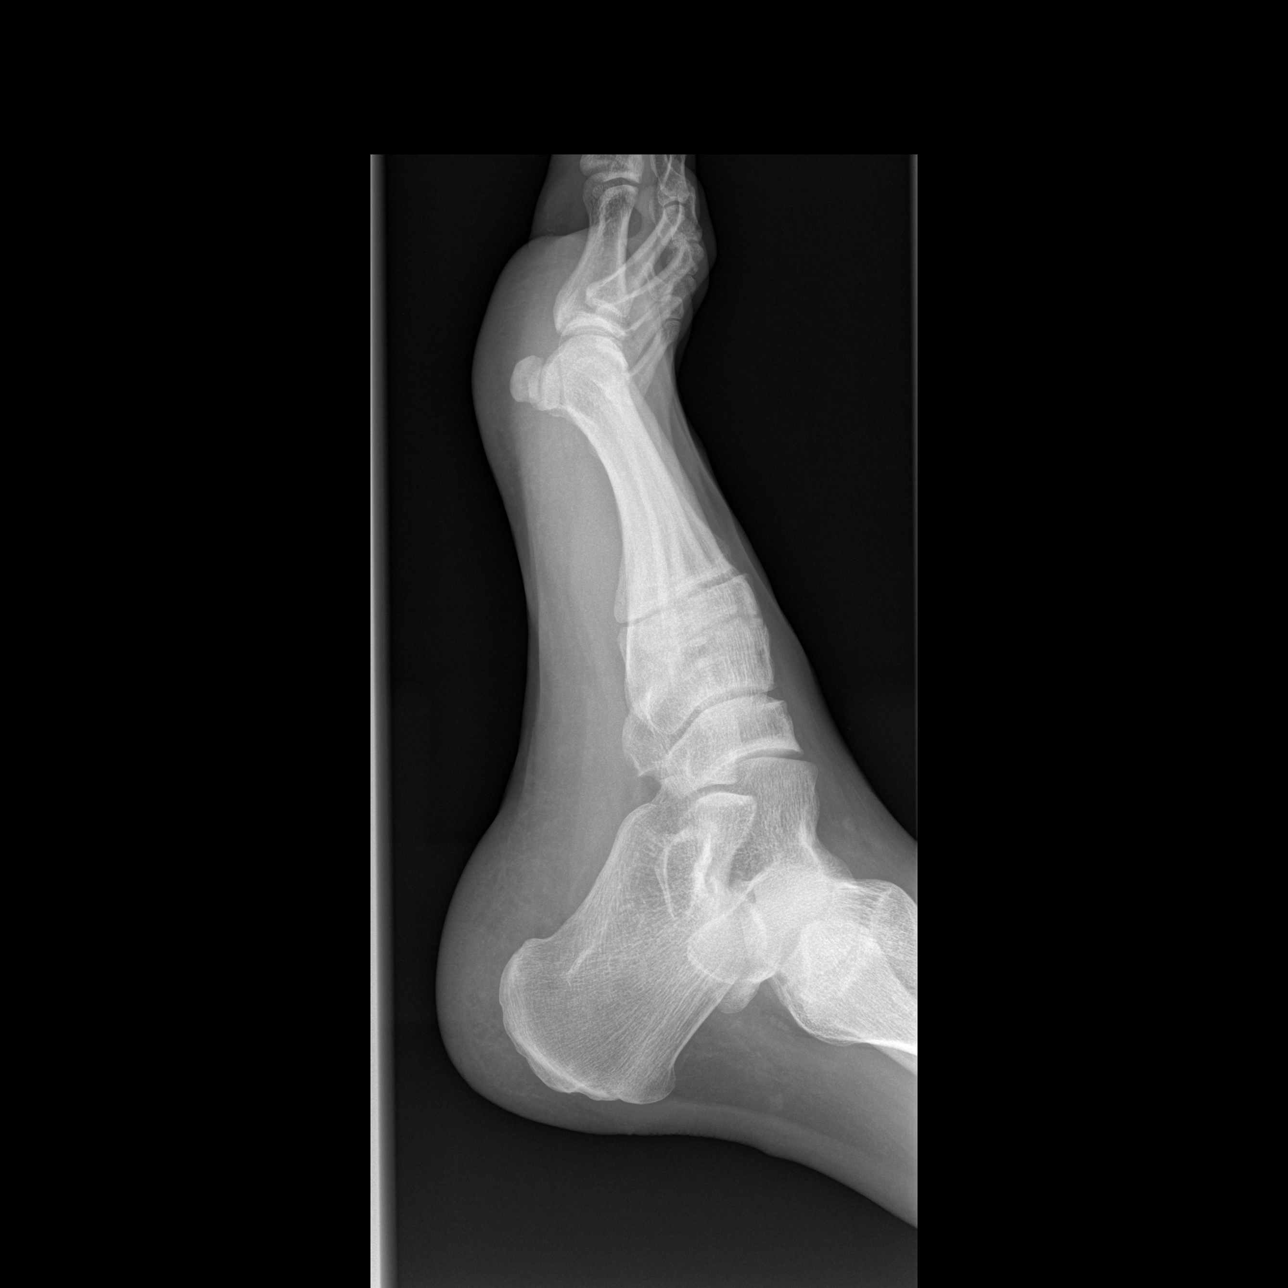

[3 of 3 positions shown; findings below may reference images not displayed]

FINDINGS: The joint spaces the joint spaces are maintained.  No
acute bony findings or destructive bony changes.  No definite
radiopaque foreign body.
IMPRESSION: No acute bony findings.  No destructive bony changes.  No
radiopaque foreign body.

## 2012-11-01 HISTORY — PX: APPENDECTOMY: SHX54

## 2012-11-01 HISTORY — PX: MANDIBLE FRACTURE SURGERY: SHX706

## 2013-01-03 ENCOUNTER — Telehealth (HOSPITAL_COMMUNITY): Payer: Self-pay

## 2013-01-03 ENCOUNTER — Encounter (HOSPITAL_COMMUNITY): Payer: Self-pay
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
## 2013-01-04 ENCOUNTER — Inpatient Hospital Stay (HOSPITAL_COMMUNITY)
Admission: EM | Admit: 2013-01-04 | Discharge: 2013-01-09 | DRG: 886 | Disposition: A | Discharge status: Home / Self Care | Payer: Federal, State, Local not specified - Other | Source: Other Acute Inpatient Hospital | Attending: Psychiatry | Admitting: Psychiatry

## 2013-01-04 ENCOUNTER — Encounter (HOSPITAL_COMMUNITY): Payer: Self-pay | Admitting: Behavioral Health

## 2013-01-04 DIAGNOSIS — F909 Attention-deficit hyperactivity disorder, unspecified type: Principal | ICD-10-CM | POA: Diagnosis present

## 2013-01-04 DIAGNOSIS — J4489 Other specified chronic obstructive pulmonary disease: Secondary | ICD-10-CM | POA: Diagnosis present

## 2013-01-04 DIAGNOSIS — F39 Unspecified mood [affective] disorder: Secondary | ICD-10-CM

## 2013-01-04 DIAGNOSIS — F411 Generalized anxiety disorder: Secondary | ICD-10-CM

## 2013-01-04 DIAGNOSIS — Z79899 Other long term (current) drug therapy: Secondary | ICD-10-CM

## 2013-01-04 DIAGNOSIS — F4310 Post-traumatic stress disorder, unspecified: Secondary | ICD-10-CM | POA: Diagnosis present

## 2013-01-04 MED ORDER — METHYLPHENIDATE HCL 10 MG PO TABS
20.0000 mg | ORAL_TABLET | Freq: Two times a day (BID) | ORAL | Status: DC
  Administered 2013-01-05 – 2013-01-09 (×9): 20 mg via ORAL
  Filled 2013-01-04 (×10): qty 2

## 2013-01-04 MED ORDER — LAMOTRIGINE 25 MG PO TABS
25.0000 mg | ORAL_TABLET | Freq: Every day | ORAL | Status: DC
  Administered 2013-01-04 – 2013-01-09 (×6): 25 mg via ORAL
  Filled 2013-01-04 (×8): qty 1

## 2013-01-04 MED ORDER — INFLUENZA VIRUS VACC SPLIT PF IM SUSP
0.5000 mL | Freq: Once | INTRAMUSCULAR | Status: AC
  Administered 2013-01-04: 0.5 mL via INTRAMUSCULAR

## 2013-01-04 MED ORDER — ACETAMINOPHEN 325 MG PO TABS
650.0000 mg | ORAL_TABLET | Freq: Four times a day (QID) | ORAL | Status: DC | PRN
  Administered 2013-01-04: 650 mg via ORAL

## 2013-01-04 MED ORDER — ALUM & MAG HYDROXIDE-SIMETH 200-200-20 MG/5ML PO SUSP
30.0000 mL | ORAL | Status: DC | PRN
  Administered 2013-01-06 – 2013-01-08 (×6): 30 mL via ORAL

## 2013-01-04 MED ORDER — CHLORDIAZEPOXIDE HCL 25 MG PO CAPS
25.0000 mg | ORAL_CAPSULE | Freq: Four times a day (QID) | ORAL | Status: DC | PRN
  Administered 2013-01-05 – 2013-01-09 (×10): 25 mg via ORAL
  Filled 2013-01-04 (×10): qty 1

## 2013-01-04 MED ORDER — TRAZODONE HCL 50 MG PO TABS
50.0000 mg | ORAL_TABLET | Freq: Every evening | ORAL | Status: DC | PRN
  Administered 2013-01-04 – 2013-01-05 (×2): 50 mg via ORAL
  Filled 2013-01-04 (×8): qty 1

## 2013-01-04 MED ORDER — MAGNESIUM HYDROXIDE 400 MG/5ML PO SUSP: 30.0000 mL | Freq: Every day | ORAL | Status: DC | PRN

## 2013-01-04 MED ORDER — METHYLPHENIDATE HCL ER (OSM) 36 MG PO TBCR
36.0000 mg | EXTENDED_RELEASE_TABLET | Freq: Every day | ORAL | Status: DC
  Administered 2013-01-04: 36 mg via ORAL
  Filled 2013-01-04: qty 1

## 2013-01-04 MED ORDER — NICOTINE 14 MG/24HR TD PT24
14.0000 mg | MEDICATED_PATCH | Freq: Every day | TRANSDERMAL | Status: DC
  Filled 2013-01-04 (×4): qty 1
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
## 2013-01-04 NOTE — Progress Notes (Signed)
Pt did not attend Karaoke group.  

## 2013-01-04 NOTE — Tx Team (Signed)
Initial Interdisciplinary Treatment Plan  PATIENT STRENGTHS: (choose at least two) Ability for insight Active sense of humor Capable of independent living Communication skills Supportive family/friends  PATIENT STRESSORS: Educational concerns Medication change or noncompliance   PROBLEM LIST: Problem List/Patient Goals Date to be addressed Date deferred Reason deferred Estimated date of resolution  "I want to be able to get my attention span better." Anxiety 01/04/2013     "I want the suicidal thoughts to diminish" 01/04/2013     "I want to be able to deal with stress." 01/04/2013                                          DISCHARGE CRITERIA:  Ability to meet basic life and health needs Adequate post-discharge living arrangements Improved stabilization in mood, thinking, and/or behavior Need for constant or close observation no longer present Safe-care adequate arrangements made  PRELIMINARY DISCHARGE PLAN: Attend aftercare/continuing care group Outpatient therapy Return to previous living arrangement  PATIENT/FAMIILY INVOLVEMENT: This treatment plan has been presented to and reviewed with the patient, [name].  The patient and family have been given the opportunity to ask questions and make suggestions.  Angeline Slim M 01/04/2013, 2:38 AM
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
## 2013-01-04 NOTE — Progress Notes (Signed)
42 y/o male who presents IVC with suicidal ideation, anxiety, medication compliance, and depression.  Patient states he has been to [facility] several times in the past.  Patient states he is here to get his medications adjusted because patient states he has not taken his medications in a couple of weeks.  Patient states he lives at home with his wife.  Patient story of his medication and is unclear.  Patient forwards little and does not initiate conversation with Clinical research associate.  Some of patient story is contradictory.  Patient states he had a seizure on 12/30/2012 and states he went to see the doctor but th doctor did not do anything for it.  Patient states he has never had a seizure before.  Patient states he takes hydrocodone that is prescriptioned but then is unclear about the rest of his medications.   Patient denies SI/HI and denies AVH but states he was originally had SI before he went to [facility] med.  Patient states he has a history of back pain, copd, and mental disorder.  Patient states he has no surgical history  Patient skin assessed patient has bruises and scratches around his right eye, and bruises to right leg, scab on right hand.  Foods and fluids offered and patient accepted both.  Patient oriented and escorted to the unit by [facility] RN.

## 2013-01-04 NOTE — Progress Notes (Signed)
[facility] LCSW Group Therapy     [facility] 1:15 - 2:30 PM   01/04/2013 3:14 PM  Type of Therapy:  Group Therapy  Participation Level:  Minimal  Participation Quality:  Appropriate  Affect:  Appropriate  Cognitive:  Appropriate  Insight:  Limited  Engagement in Therapy:  Limited  Modes of Intervention:  Discussion, Education, Exploration, Problem-solving, Rapport Building and Support  Summary of Progress/Problems:  Patient  left group before presentation was completed.  Wynn Banker 01/04/2013, 3:14 PM

## 2013-01-04 NOTE — Progress Notes (Signed)
D:  Patient did not attend groups this morning.  States he cannot sit still because of ADHD.  Saw Dr. Dub Mikes in the hallway and asked him to take care of him.  Has been attending groups and in the milieu more since stating Concerta.  Rates depression and hopelessness at 9 today.  Admits to off and on suicidal thoughts.  Agrees to seek out staff if feeling unsafe.  A:  Patient educated about medications.  Discussed case with Dr. Dub Mikes who agreed to see that patient as he knows him and his history.   R:  Pleasant and cooperative.  More focused and more able to interact after receiving medication.  Tolerating medications well.  Safety is maintained on the unit.
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
## 2013-01-04 NOTE — Progress Notes (Signed)
Adult Psychoeducational Group Note  Date:  01/04/2013 Time:  3:10 PM  Group Topic/Focus:  Emotional Education:   The focus of this group is to discuss what feelings/emotions are, and how they are experienced.  Participation Level:  Minimal  Participation Quality:  Appropriate  Affect:  Appropriate  Cognitive:  Alert  Insight: Good  Engagement in Group:  Limited  Modes of Intervention:  Discussion  Additional Comments:  The topic of discussion was joy and how we define it.  We also discussed the importance of introducing humor and laughter into our lives.  Patient arrived late for group but was able to share.   [name] Mae 01/04/2013, 3:10 PM

## 2013-01-04 NOTE — Progress Notes (Signed)
[facility] LCSW Aftercare Discharge Planning Group Note  01/04/2013 12:20 PM  Participation Quality:    Patient did not attend group.  Wynn Banker 01/04/2013, 12:20 PM

## 2013-01-04 NOTE — Progress Notes (Signed)
Patient ID: [name], male   DOB: 11-02-1970, 42 y.o.   MRN: [id number] D: Pt. Reports depression at "5-6" of 10. Pt. Denies SHI. Pt. Reports "tired, I didn't get no sleep last night". A: Pt. Will be monitored q46min for safety. Pt. Encouraged to attend karaoke. Pt. Is safe on the unit. Pt. Did not attend karaoke.

## 2013-01-05 MED ORDER — CYCLOBENZAPRINE HCL 10 MG PO TABS
10.0000 mg | ORAL_TABLET | Freq: Three times a day (TID) | ORAL | Status: DC | PRN
  Administered 2013-01-05 – 2013-01-09 (×6): 10 mg via ORAL
  Filled 2013-01-05 (×7): qty 1

## 2013-01-05 MED ORDER — GUAIFENESIN ER 600 MG PO TB12
600.0000 mg | ORAL_TABLET | Freq: Two times a day (BID) | ORAL | Status: DC | PRN
  Administered 2013-01-05 – 2013-01-08 (×5): 600 mg via ORAL
  Filled 2013-01-05 (×5): qty 1

## 2013-01-05 MED ORDER — NICOTINE 21 MG/24HR TD PT24
21.0000 mg | MEDICATED_PATCH | Freq: Every day | TRANSDERMAL | Status: DC
  Administered 2013-01-05 – 2013-01-09 (×5): 21 mg via TRANSDERMAL
  Filled 2013-01-05 (×5): qty 1

## 2013-01-05 MED ORDER — NICOTINE 21 MG/24HR TD PT24
21.0000 mg | MEDICATED_PATCH | Freq: Every day | TRANSDERMAL | Status: DC
  Filled 2013-01-05 (×2): qty 1

## 2013-01-05 MED ORDER — TRAZODONE HCL 100 MG PO TABS
100.0000 mg | ORAL_TABLET | Freq: Every evening | ORAL | Status: DC | PRN
  Administered 2013-01-05 – 2013-01-08 (×5): 100 mg via ORAL
  Filled 2013-01-05 (×10): qty 1

## 2013-01-05 MED ORDER — TRAMADOL HCL 50 MG PO TABS
50.0000 mg | ORAL_TABLET | Freq: Four times a day (QID) | ORAL | Status: DC | PRN
  Administered 2013-01-05 – 2013-01-09 (×13): 50 mg via ORAL
  Filled 2013-01-05 (×13): qty 1

## 2013-01-05 NOTE — Progress Notes (Signed)
[facility] LCSW Group Therapy     [facility] 1:15 - 2:30 PM   01/05/2013 3:25 PM  Type of Therapy:  Group Therapy  Participation Level:  Minimal  Participation Quality:  Appropriate  Affect:  Appropriate  Cognitive:  Appropriate  Insight:  Limited  Engagement in Therapy:  Limited  Modes of Intervention:  Discussion, Education, Exploration, Problem-solving, Rapport Building and Support  Summary of Progress/Problems: Patient shared relapse for him would be returning to negative thinking and old habits   Sage Kopera, Joes[place] July 01/05/2013, 3:25 PM

## 2013-01-05 NOTE — Progress Notes (Signed)
Goals  Group Note  Date:  01/05/2013 Time: 1015  Group Topic/Focus:  Identifying Goals :   The focus of this group is to help patients identify their personal goals  As well as help motivate them to begin to make positive changes in their lives.  Participation Level:  passive  Participation Quality: good  Affect: flat  Cognitive:    Insight:  good  Engagement in Group: engaged  Additional Comments:  PD RN [facility]

## 2013-01-05 NOTE — Progress Notes (Signed)
D: Patient denies SI/HI and A/V hallucinations; patient reports sleep is poor; reports appetite is improving ; reports energy level is low ; reports ability to pay attention is poor; rates depression as 4/10; rates hopelessness 3/10;   A: Monitored q 15 minutes; patient encouraged to attend groups; patient educated about medications; patient given medications per physician orders; patient encouraged to express feelings and/or concerns  R: Patient frequently visits the nursing station with odd request but he overall is appropriate and cooperative; appears to have a bright affect and has a sense of humor;  patient's interaction with staff and peers is appropriate;patient is taking medications as prescribed and tolerating medications; patient is attending all groups
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
## 2013-01-05 NOTE — Progress Notes (Signed)
[facility] Group Notes:  (Nursing/MHT/Case Management/Adjunct)  Date:  01/05/2013  Time:  2000  Type of Therapy:  Psychoeducational Skills  Participation Level:  Active  Participation Quality:  Attentive  Affect:  Appropriate  Cognitive:  Appropriate  Insight:  Good  Engagement in Group:  Engaged  Modes of Intervention:  Education  Summary of Progress/Problems: The patient stated that his day went well. First of all, he stated that he attended groups.  Secondly, he mentioned that he felt better (less depressed). Finally, he mentioned that he felt relieved upon learning that his doctor ordered anxiety medication for him. His goal for tomorrow is to get rid of his cold.   Hazle Coca S 01/05/2013, 10:02 PM

## 2013-01-05 NOTE — Progress Notes (Signed)
Recreation Therapy Notes  Date: 03.07.2014  Time: 3:00pm  Location: Art Room   Group Topic/Focus: Leisure Education   Participation Level:  Did not attend  [employer], LRT/CTRS   Jearl Klinefelter 01/05/2013 4:19 PM
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
## 2013-01-06 NOTE — Progress Notes (Signed)
[facility] Group Notes:  (Nursing/MHT/Case Management/Adjunct)  Date:  01/06/2013  Time:  2000  Type of Therapy:  Psychoeducational Skills  Participation Level:  Active  Participation Quality:  Appropriate  Affect:  Blunted  Cognitive:  Appropriate  Insight:  Good  Engagement in Group:  Improving  Modes of Intervention:  Education  Summary of Progress/Problems: The patient verbalized in group this evening that he had a good day. First of all, he stated that he felt better due to the medication. Secondly, he stated that he attended the groups and that he enjoyed the topics covered. Finally, he felt upbeat due to going outside for fresh air.   Hazle Coca S 01/06/2013, 11:24 PM

## 2013-01-06 NOTE — Progress Notes (Signed)
Patient ID: [name], male   DOB: 09-23-1971, 42 y.o.   MRN: [id number] D: No new data from previous assessment. Patient in bed sleeping. Respiration regular and unlabored. No sign of distress noted at this time A: 15 mins checks for  Safety. R: Patient remains asleep. Pt is safe.

## 2013-01-06 NOTE — Clinical Social Work Note (Signed)
[facility] Group Notes:  (Clinical Social Work)  01/06/2013   3:00-4:00PM  Summary of Progress/Problems:   The main focus of today's process group was for the patient to identify something in their life that led to their hospitalization that they would like to change, then to discuss their motivation to change.  The Stages of Change were explained to the group, then each patient identified where they are in that process.  Scale was used with motivational interviewing to determine the patient's current motivation to change, with 1 being total lack of motivation and 10 being total commitment to change.  The patient expressed that he is going to focus more on himself and that he is motivated at a level of 9 out of 10 to do this.  He was out of the room for much of group, and did not hear the explanation about Stages of Change.  Type of Therapy:  Process Group  Participation Level:  Minimal  Participation Quality:  Inattentive  Affect:  Blunted  Cognitive:  Oriented  Insight:  Distracting  Engagement in Therapy:  Improving  Modes of Intervention:  Clarification, Support and Processing, Exploration, Discussion   Ambrose Mantle, LCSW 01/06/2013, 4:21 PM

## 2013-01-06 NOTE — Progress Notes (Signed)
[name] is cooperative, pleasant but adamantly focused on pain meds ( ie flexeril, vistaril and librium). He is seen socializing in the milieu, he laughs and jokes and flirts with male patients and comes straight up to the med  Window and  complains about his pain and his acute and chronic discomforts.   A He speaks with pressured  verbage and memorizes when prn meds are due again.   R Safety is in place and POC cont.

## 2013-01-07 MED ORDER — ONDANSETRON 4 MG PO TBDP
4.0000 mg | ORAL_TABLET | Freq: Once | ORAL | Status: AC
  Administered 2013-01-07: 4 mg via ORAL
  Filled 2013-01-07: qty 1

## 2013-01-07 NOTE — Progress Notes (Addendum)
[name] is seen OOB UAL on the 500 hall today, tolerated fair. HE takes his medications as ordered by the MD. He completed his self inventory and on it he wrote he denied SI within the past 24 hrs, he rated his depression and hopelessness " 0/0", respectively and he stated his DC plan is to finish school, take meds and go to all  Appts.    A He is engaged in his POC, he is talking with this nurse and trying to understand his feelings and his resulting behaviors.    R safety is in place and POC cont with therapeutic relationship.

## 2013-01-07 NOTE — Progress Notes (Signed)
Psychoeducational Group Note  Psychoeducational Group Note  Date: 01/07/2013 Time:  01/07/2013  Group Topic/Focus:  Gratefulness:  The focus of this group is to help patients identify what two things they are most grateful for in their lives. What helps ground them and to center them on their work to their recovery.  Participation Level:  Active  Participation Quality:  Appropriate  Affect:  Appropriate  Cognitive:  Appropriate  Insight:  Engaged  Engagement in Group:  Engaged  Additional Comments:    Dione Housekeeper

## 2013-01-07 NOTE — Progress Notes (Signed)
Psychoeducational Group Note  Date: 01/07/2013 Time: 1015  Group Topic/Focus:  Making Healthy Choices:   The focus of this group is to help patients identify negative/unhealthy choices they were using prior to admission and identify positive/healthier coping strategies to replace them upon discharge.  Participation Level:  Did Not Attend  PAdditional Comments:    01/07/2013,6:46 PM Rich Brave

## 2013-01-07 NOTE — Progress Notes (Signed)
[facility] MD Progress Note  01/07/2013 10:43 AM [name]  MRN:  [id number] Subjective:  Pt complains of coughing a lot.  He has able to let people know things that bother him. .  Diagnosis:   Axis I: ADHD, combined type Axis II: Deferred Axis III:  Past Medical History  Diagnosis Date  . COPD (chronic obstructive pulmonary disease)   . Arthritis   . Seizures     only once 12/30/2012   Axis IV: other psychosocial or environmental problems and problems related to social environment Axis V: 51-60 moderate symptoms  ADL's:  Intact  Sleep: Good  Appetite:  Fair  Suicidal Ideation:  Plan:  not present Homicidal Ideation:  Plan:  not present AEB (as evidenced by):  Psychiatric Specialty Exam: Review of Systems  Respiratory: Positive for cough and sputum production.     Blood pressure 121/73, pulse 89, temperature 98.1 F (36.7 C), temperature source Oral, resp. rate 16, height 6' 2.5" (1.892 m), weight 89.812 kg (198 lb).Body mass index is 25.09 kg/(m^2).  General Appearance: Casual  Eye Contact::  Good  Speech:  Clear and Coherent and Normal Rate  Volume:  Normal  Mood:  Euthymic  Affect:  Appropriate  Thought Process:  Coherent, Goal Directed, Intact and Logical  Orientation:  Full (Time, Place, and Person)  Thought Content:  completion of GED  Suicidal Thoughts:  No  Homicidal Thoughts:  No  Memory:  Immediate;   Good Recent;   Good Remote;   Good  Judgement:  Fair  Insight:  Good  Psychomotor Activity:  Normal  Concentration:  Fair  Recall:  Good  Akathisia:  NA  Handed:  Right  AIMS (if indicated):     Assets:  Communication Skills Desire for Improvement Housing Intimacy Leisure Time Physical Health Transportation Vocational/Educational  Sleep:  Number of Hours: 6.75   Current Medications: Current Facility-Administered Medications  Medication Dose Route Frequency Provider Last Rate Last Dose  . acetaminophen (TYLENOL) tablet 650 mg  650 mg Oral Q6H PRN  Kerry Hough, PA-C   650 mg at 01/04/13 1713  . alum & mag hydroxide-simeth (MAALOX/MYLANTA) 200-200-20 MG/5ML suspension 30 mL  30 mL Oral Q4H PRN Kerry Hough, PA-C   30 mL at 01/06/13 2122  . chlordiazePOXIDE (LIBRIUM) capsule 25 mg  25 mg Oral QID PRN Kerry Hough, PA-C   25 mg at 01/07/13 4696  . cyclobenzaprine (FLEXERIL) tablet 10 mg  10 mg Oral TID PRN Rachael Fee, MD   10 mg at 01/07/13 [phone]  . guaiFENesin (MUCINEX) 12 hr tablet 600 mg  600 mg Oral BID PRN Rachael Fee, MD   600 mg at 01/06/13 2241  . lamoTRIgine (LAMICTAL) tablet 25 mg  25 mg Oral Daily Rachael Fee, MD   25 mg at 01/07/13 0845  . magnesium hydroxide (MILK OF MAGNESIA) suspension 30 mL  30 mL Oral Daily PRN Kerry Hough, PA-C      . methylphenidate (RITALIN) tablet 20 mg  20 mg Oral BID WC Rachael Fee, MD   20 mg at 01/07/13 0845  . nicotine (NICODERM CQ - dosed in mg/24 hours) patch 21 mg  21 mg Transdermal Q0600 Himabindu Ravi, MD   21 mg at 01/07/13 0848  . traMADol (ULTRAM) tablet 50 mg  50 mg Oral Q6H PRN Rachael Fee, MD   50 mg at 01/07/13 [phone]  . traZODone (DESYREL) tablet 100 mg  100 mg Oral QHS,MR X 1 Madie Reno  Jorja Loa, MD   100 mg at 01/06/13 2242    Lab Results: No results found for this or any previous visit (from the past 48 hour(s)).  Physical Findings: AIMS: Facial and Oral Movements Muscles of Facial Expression: None, normal Lips and Perioral Area: None, normal Jaw: None, normal Tongue: None, normal,Extremity Movements Upper (arms, wrists, hands, fingers): None, normal Lower (legs, knees, ankles, toes): None, normal, Trunk Movements Neck, shoulders, hips: None, normal, Overall Severity Severity of abnormal movements (highest score from questions above): None, normal Incapacitation due to abnormal movements: None, normal Patient's awareness of abnormal movements (rate only patient's report): No Awareness, Dental Status Current problems with teeth and/or dentures?: No Does patient  usually wear dentures?: No  CIWA:    COWS:     Treatment Plan Summary: Pt has learned about things that he has repressed and learned to let thiem 'go'   Also  he has learned to Live life not only to survive it.   Plan:  Medical Decision Making Problem Points:  Established problem, stable/improving (1) Data Points:  Review or order medicine tests (1)  I certify that inpatient services furnished can reasonably be expected to improve the patient's condition.   Zamari Bonsall 01/07/2013, 10:43 AM

## 2013-01-07 NOTE — Progress Notes (Signed)
Spoke with pt 1:1 who reports having a good day. Continues to complain of chronic back pain, anxiety. Takes prn meds regularly, as if they are scheduled. He was isolative to his room at times and when visible in the milieu, he kept to himself. Support given, med education provided. Explained to pt he is at high risk for falls and reviewed precautions. He denies SI/HI/AVH and remains safe .Lawrence Marseilles

## 2013-01-07 NOTE — Progress Notes (Signed)
Found pt lying in bed. Pt complaining of unrelieved pain this evening. Pain actually increased after admin of prn's. He reports it is so uncomfortable he is unable to attend group. He plans to speak with MD tomorrow about an adjustment in pain meds. Otherwise pt reports having a positive day. He reports gaining insight into his stressors and depression. Pt offered support, encouragement. He continues to deny SI/HI/AVH and remains safe. [name]

## 2013-01-07 NOTE — Progress Notes (Signed)
Psychoeducational Group Note  Date: 01/07/2013 Time: 1015  Group Topic/Focus:  Making Healthy Choices:   The focus of this group is to help patients identify negative/unhealthy choices they were using prior to admission and identify positive/healthier coping strategies to replace them upon discharge.  Participation Level:  Active  Participation Quality:  Appropriate  Affect:  Appropriate  Cognitive:  Appropriate  Insight:  Engaged  Engagement in Group:  Engaged  Additional Comments:    01/07/2013,6:42 PM Duke, Joie Bimler

## 2013-01-07 NOTE — Progress Notes (Signed)
Psychoeducational Group Note  Date:  01/07/2013 Time:  2000  Group Topic/Focus:  Wrap-Up Group:   The focus of this group is to help patients review their daily goal of treatment and discuss progress on daily workbooks.  Participation Level: Did Not Attend  Participation Quality:  Not Applicable  Affect:  Not Applicable  Cognitive:  Not Applicable  Insight:  Not Applicable  Engagement in Group: Not Applicable  Additional Comments:  The patient slept in his bed rather than attend group.   Hazle Coca S 01/07/2013, 9:10 PM

## 2013-01-07 NOTE — Clinical Social Work Note (Signed)
[facility] Group Notes:  (Clinical Social Work)  01/07/2013   3:00-4:00PM  Summary of Progress/Problems:    The main focus of today's process group was to define "support" and describe what healthy supports are.  We then discussed how and why to increase patient supports, using motivational interviewing.  An emphasis was placed on using counselor, doctor, therapy groups, self-help groups and problem-specific support groups to expand supports.   The patient expressed little in group, even when called on.  He said he does provide encouragement to others, and appeared to be listening intently to others in the group.  He left before the end of group.  Type of Therapy:  Process Group  Participation Level:  Minimal  Participation Quality:  Attentive  Affect:  Blunted and Depressed  Cognitive:  Appropriate and Oriented  Insight:  Developing/Improving  Engagement in Therapy:  Developing/Improving  Modes of Intervention:  Education,  Support and Processing, Exploration, Discussion   Ambrose Mantle, LCSW 01/07/2013, 4:03 PM

## 2013-01-08 DIAGNOSIS — F4310 Post-traumatic stress disorder, unspecified: Secondary | ICD-10-CM

## 2013-01-08 DIAGNOSIS — F909 Attention-deficit hyperactivity disorder, unspecified type: Principal | ICD-10-CM

## 2013-01-08 NOTE — Progress Notes (Signed)
Adult Psychoeducational Group Note  Date:  01/08/2013 Time:  11:42 AM  Group Topic/Focus:  Wellness Toolbox:   The focus of this group is to discuss various aspects of wellness, balancing those aspects and exploring ways to increase the ability to experience wellness.  Patients will create a wellness toolbox for use upon discharge.  Participation Level:  Active  Participation Quality:  Appropriate, Attentive and Sharing  Affect:  Appropriate  Cognitive:  Appropriate  Insight: Appropriate  Engagement in Group:  Engaged  Modes of Intervention:  Discussion  Additional Comments:  Pt was appropriate and attentive while attending group. Pt stated that he wants to finish school and maybe join the [employer] to help with his physical wellness.   Sharyn Lull 01/08/2013, 11:42 AM

## 2013-01-08 NOTE — Progress Notes (Signed)
The focus of this group is to help patients review their daily goal of treatment and discuss progress on daily workbooks. Pt attended the evening wrap-up group and responded to all discussion prompts. Pt stated he had a good day today because he was told that he would be going home tomorrow, which he was looking forward to. Pt stated that he felt as though he had everything in order for his discharge, including aftercare plans, and needed nothing else at this time. Pt smiled during group and appeared engaged when being spoken to.

## 2013-01-08 NOTE — Progress Notes (Addendum)
D:  Patient's self inventory sheet, patient sleeps well, has good appetite, normal energy level, improving attention span.  Denied depression and hopelessness.  Rated anxiety #5.  Denied withdrawals.  Denied SI.  Has experience pain #8.  "To try to set my pain medication adjusted.   Worst pain #8.  After discharge, plans to continue to medication finish my GED and enroll into college.   Denied SI and HI.   Denied A/V hallucinations.  Want to put in doctor's notes that his pain medication needs to be adjusted and so does his ritalin.  No discharge plans.  No problems taking meds after discharge. A:  Medications administer per MD order.  Support and encouragement given throughout day.  R:  Following treatment plan.  Denied SI and HI.   Denied A/V hallucinations.  Contracts or safety. 1715  Patient was medicated for anxiety and indigestion.   Went to recreation outside building with group.

## 2013-01-08 NOTE — Progress Notes (Signed)
[facility] LCSW Group Therapy        Overcoming Obstacles 1:15 2:30 PM         01/08/2013 3:35 PM  Type of Therapy:  Group Therapy  Participation Level:  Active  Participation Quality:  Appropriate and Attentive  Affect:  Appropriate  Cognitive:  Alert and Appropriate  Insight:  Engaged  Engagement in Therapy:  Engaged  Modes of Intervention:  Discussion, Exploration, Problem-solving, Rapport Building and Support  Summary of Progress/Problems:  Patient reports he must overcome the obstacle of not following up with outpatient appointments.  He shared he has used the excuse of not being in agreement with MD as reason for not following up.  Wynn Banker 01/08/2013, 3:35 PM

## 2013-01-08 NOTE — Progress Notes (Signed)
[facility] MD Progress Note  01/08/2013 7:13 PM [name]  MRN:  [id number] Subjective: [name] endorses that he is gradually getting to feel better. He plans to go back to get his GED. Now with the way he is feeling, he feels that he is going to be able to make it, as he is able to concentrate better. He endorses that he is still having the intrusive thoughts but they are getting better. Still endorses pain in his back. Plans to pursue follow up with his doctor once he gets out of here Diagnosis:  ADHD, PTSD,Mood Disorder NOS  ADL's:  Intact  Sleep: Fair  Appetite:  Fair  Suicidal Ideation:  Plan:  denies Intent:  denies Means:  denies Homicidal Ideation:  Plan:  denies Intent:  denies Means:  denies AEB (as evidenced by):  Psychiatric Specialty Exam: Review of Systems  Constitutional: Negative.   HENT: Negative.   Eyes: Negative.   Respiratory: Negative.   Cardiovascular: Negative.   Gastrointestinal: Negative.   Genitourinary: Negative.   Musculoskeletal: Negative.   Skin: Negative.   Neurological: Negative.   Endo/Heme/Allergies: Negative.   Psychiatric/Behavioral: The patient is nervous/anxious.     Blood pressure 100/63, pulse 65, temperature 97.3 F (36.3 C), temperature source Oral, resp. rate 18, height 6' 2.5" (1.892 m), weight 89.812 kg (198 lb).Body mass index is 25.09 kg/(m^2).  General Appearance: Fairly Groomed  Patent attorney::  Fair  Speech:  Clear and Coherent and Slow  Volume:  Decreased  Mood:  Anxious and worried  Affect:  Restricted  Thought Process:  Coherent and Goal Directed  Orientation:  Full (Time, Place, and Person)  Thought Content:  worries, concerns  Suicidal Thoughts:  No  Homicidal Thoughts:  No  Memory:  Immediate;   Fair Recent;   Fair Remote;   Fair  Judgement:  Fair  Insight:  Shallow  Psychomotor Activity:  Restlessness  Concentration:  Fair  Recall:  Fair  Akathisia:  No  Handed:  Right  AIMS (if indicated):     Assets:   Desire for Improvement  Sleep:  Number of Hours: 6   Current Medications: Current Facility-Administered Medications  Medication Dose Route Frequency Trina Asch Last Rate Last Dose  . acetaminophen (TYLENOL) tablet 650 mg  650 mg Oral Q6H PRN Kerry Hough, PA-C   650 mg at 01/04/13 1713  . alum & mag hydroxide-simeth (MAALOX/MYLANTA) 200-200-20 MG/5ML suspension 30 mL  30 mL Oral Q4H PRN Kerry Hough, PA-C   30 mL at 01/08/13 1705  . chlordiazePOXIDE (LIBRIUM) capsule 25 mg  25 mg Oral QID PRN Kerry Hough, PA-C   25 mg at 01/08/13 1705  . cyclobenzaprine (FLEXERIL) tablet 10 mg  10 mg Oral TID PRN Rachael Fee, MD   10 mg at 01/07/13 [phone]  . guaiFENesin (MUCINEX) 12 hr tablet 600 mg  600 mg Oral BID PRN Rachael Fee, MD   600 mg at 01/08/13 1002  . lamoTRIgine (LAMICTAL) tablet 25 mg  25 mg Oral Daily Rachael Fee, MD   25 mg at 01/08/13 1191  . magnesium hydroxide (MILK OF MAGNESIA) suspension 30 mL  30 mL Oral Daily PRN Kerry Hough, PA-C      . methylphenidate (RITALIN) tablet 20 mg  20 mg Oral BID WC Rachael Fee, MD   20 mg at 01/08/13 1152  . nicotine (NICODERM CQ - dosed in mg/24 hours) patch 21 mg  21 mg Transdermal Q0600 Patrick North, MD  21 mg at 01/08/13 0654  . traMADol (ULTRAM) tablet 50 mg  50 mg Oral Q6H PRN Rachael Fee, MD   50 mg at 01/08/13 1432  . traZODone (DESYREL) tablet 100 mg  100 mg Oral QHS,MR X 1 Rachael Fee, MD   100 mg at 01/07/13 2243    Lab Results: No results found for this or any previous visit (from the past 48 hour(s)).  Physical Findings: AIMS: Facial and Oral Movements Muscles of Facial Expression: None, normal Lips and Perioral Area: None, normal Jaw: None, normal Tongue: None, normal,Extremity Movements Upper (arms, wrists, hands, fingers): None, normal Lower (legs, knees, ankles, toes): None, normal, Trunk Movements Neck, shoulders, hips: None, normal, Overall Severity Severity of abnormal movements (highest score from  questions above): None, normal Incapacitation due to abnormal movements: None, normal Patient's awareness of abnormal movements (rate only patient's report): No Awareness, Dental Status Current problems with teeth and/or dentures?: No Does patient usually wear dentures?: No  CIWA:  CIWA-Ar Total: 0 COWS:  COWS Total Score: 0  Treatment Plan Summary: Daily contact with patient to assess and evaluate symptoms and progress in treatment Medication management  Plan: Supportive approach/coping skills           Will continue medications             Medical Decision Making Problem Points:  Review of psycho-social stressors (1) Data Points:  Review of medication regiment & side effects (2)  I certify that inpatient services furnished can reasonably be expected to improve the patient's condition.   LUGO,IRVING A 01/08/2013, 7:13 PM

## 2013-01-09 MED ORDER — METHYLPHENIDATE HCL 10 MG PO TABS: ORAL_TABLET | ORAL | Status: DC

## 2013-01-09 MED ORDER — LAMOTRIGINE 25 MG PO TABS: 25.0000 mg | ORAL_TABLET | Freq: Every day | ORAL | Status: DC

## 2013-01-09 MED ORDER — TRAZODONE HCL 100 MG PO TABS: 100.0000 mg | ORAL_TABLET | Freq: Every evening | ORAL | Status: DC | PRN

## 2013-01-09 NOTE — Progress Notes (Signed)
Discharge Note:  Patient left [facility] with sheriff who is taking him home to his wife.   Denied SI and HI.   Denied A/V hallucinations.  Denied pain.  Stated he appreciated all the staff has done to assist him while at [facility].  Patient received all his belongings from locker and his room.   Suicide prevention information given to patient who stated he understood and had no questions.

## 2013-01-09 NOTE — [facility] Suicide Risk Assessment (Signed)
Suicide Risk Assessment  Discharge Assessment     Demographic Factors:  Male and Caucasian  Mental Status Per Nursing Assessment::   On Admission:  Suicidal ideation indicated by patient;Self-harm thoughts  Current Mental Status by Physician: In full contact with reality. There are no suicidal ideas, plans or intent. His mood is euthymic, his affect is appropriate. He is willing and motivated to pursue outpatient treatment. He feels ready to go back to finish his GED now that he can concentrate   Loss Factors: NA  Historical Factors: NA  Risk Reduction Factors:   Sense of responsibility to family, Living with another person, especially a relative and Positive social support  Continued Clinical Symptoms: PTSD, ADHD   Cognitive Features That Contribute To Risk:  Closed-mindedness Thought constriction (tunnel vision)    Suicide Risk:  Minimal: No identifiable suicidal ideation.  Patients presenting with no risk factors but with morbid ruminations; may be classified as minimal risk based on the severity of the depressive symptoms  Discharge Diagnoses:   AXIS I:  PTSD, Mood Disorder NOS, ADHD AXIS II:  Deferred AXIS III:   Past Medical History  Diagnosis Date  . COPD (chronic obstructive pulmonary disease)   . Arthritis   . Seizures     only once 12/30/2012   AXIS IV:  educational problems AXIS V:  61-70 mild symptoms  Plan Of Care/Follow-up recommendations:  Activity:  as tolerated Diet:  regular Follow up outpatient basis Is patient on multiple antipsychotic therapies at discharge:  No   Has Patient had three or more failed trials of antipsychotic monotherapy by history:  No  Recommended Plan for Multiple Antipsychotic Therapies: N/A   LUGO,IRVING A 01/09/2013, 10:12 AM

## 2013-01-09 NOTE — Progress Notes (Signed)
[facility] Adult Case Management Discharge Plan :  Will you be returning to the same living situation after discharge: Yes,  Patient returning home with wife At discharge, do you have transportation home?:Yes,  Patient being transported by [facility]. Do you have the ability to pay for your medications:No.  Patient assisted with indigent medications.  Release of information consent forms completed and in the chart;  Patient's signature needed at discharge.  Patient to Follow up at: Follow-up Information   Follow up with [facility]. (Please go the walk-in clinic on second floor on Wednesday, January 10, 2013 between 8AM-3PM.  Please bring prescriptions with you along with picture ID, proof of residency and income.  Please be patient as it could take 2 hours to be seen.)    Contact information:   [address] [place], [place]   [address]   [phone]      Patient denies SI/HI:   Patient is not endorsing SI/HI or other thoughts of self harm   Safety Planning and Suicide Prevention discussed:  Yes,  Reviewed during aftercare groups.  Wynn Banker 01/09/2013, 8:35 AM
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
## 2013-01-09 NOTE — Progress Notes (Signed)
[facility] LCSW Aftercare Discharge Planning Group Note  01/09/2013 11:34 AM  Participation Quality:  Appropriate and Attentive  Affect:  Appropriate  Cognitive:  Appropriate  Insight:  Engaged  Engagement in Group:  Engaged  Modes of Intervention:  Exploration, Problem-solving, Rapport Building and Support  Summary of Progress/Problems:  Patient reports doing great and being ready to discharge home today.  He denies SI/HI and rates all symptoms at zero.  Patient informed contact had been made with wife and regarding transportation.  Patient shared he plans to keep all follow up appointments.  Wynn Banker 01/09/2013, 11:34 AM

## 2013-01-09 NOTE — Progress Notes (Signed)
D:  Patient's self inventory sheet, patient sleeps well, has good appetite, low energy level, good attention span.  Denied depression and hopelessness.  Denied withdrawals.  Denied SI.  Pain goal today to tolerate his pain.  Worst pain #5.  Plans to continue follow up/ take medications/ continue tin school/ go back to work.  No questions for staff.   Does have discharge plans.  No problems taking meds after discharge. A:  Medications administered per MD order.  Support and encouragement given throughout day. R:  Following treatment plan.  Denied SI and HI.  Denied A/V hallucinations.  Contracts for safety.

## 2013-01-09 NOTE — Progress Notes (Signed)
[facility] INPATIENT:  Family/Significant Other Suicide Prevention Education  Suicide Prevention Education:  Education Completed, Margarito Dehaas, Wife, [phone]) has been identified by the patient as the family member/significant other with whom the patient will be residing, and identified as the person(s) who will aid the patient in the event of a mental health crisis (suicidal ideations/suicide attempt).  With written consent from the patient, the family member/significant other has been provided the following suicide prevention education, prior to the and/or following the discharge of the patient.  The suicide prevention education provided includes the following:  Suicide risk factors  Suicide prevention and interventions  National Suicide Hotline telephone number  [facility] assessment telephone number  [facility] Emergency Assistance 911  [facility] and/or Residential Mobile Crisis Unit telephone number  Request made of family/significant other to:  Remove weapons (e.g., guns, rifles, knives), all items previously/currently identified as safety concern.  Wife advised there are no guns in the home.  Remove drugs/medications (over-the-counter, prescriptions, illicit drugs), all items previously/currently identified as a safety concern.  The family member/significant other verbalizes understanding of the suicide prevention education information provided.  The family member/significant other agrees to remove the items of safety concern listed above.  Wynn Banker 01/09/2013, 8:34 AM

## 2013-01-12 NOTE — Progress Notes (Signed)
Patient Discharge Instructions:  After Visit Summary (AVS):   Faxed to:  01/12/13 Discharge Summary Note:   Faxed to:  01/12/13 Psychiatric Admission Assessment Note:   Faxed to:  01/12/13 Suicide Risk Assessment - Discharge Assessment:   Faxed to:  01/12/13 Faxed/Sent to the Next Level Care provider:  01/12/13 Faxed to [facility] @ [phone]  Jerelene Redden, 01/12/2013, 3:51 PM

## 2014-02-13 ENCOUNTER — Encounter (HOSPITAL_COMMUNITY): Payer: Self-pay | Admitting: Emergency Medicine

## 2014-02-13 ENCOUNTER — Emergency Department (HOSPITAL_COMMUNITY)
Admission: EM | Admit: 2014-02-13 | Discharge: 2014-02-13 | Discharge status: Against Medical Advice | Payer: Medicaid Other | Attending: Emergency Medicine | Admitting: Emergency Medicine

## 2014-02-13 DIAGNOSIS — F1110 Opioid abuse, uncomplicated: Secondary | ICD-10-CM | POA: Insufficient documentation

## 2014-02-13 DIAGNOSIS — F17200 Nicotine dependence, unspecified, uncomplicated: Secondary | ICD-10-CM | POA: Insufficient documentation

## 2014-02-13 DIAGNOSIS — J4489 Other specified chronic obstructive pulmonary disease: Secondary | ICD-10-CM | POA: Insufficient documentation

## 2014-02-13 DIAGNOSIS — J449 Chronic obstructive pulmonary disease, unspecified: Secondary | ICD-10-CM | POA: Insufficient documentation

## 2014-02-13 DIAGNOSIS — G40909 Epilepsy, unspecified, not intractable, without status epilepticus: Secondary | ICD-10-CM | POA: Insufficient documentation

## 2014-02-13 DIAGNOSIS — F988 Other specified behavioral and emotional disorders with onset usually occurring in childhood and adolescence: Secondary | ICD-10-CM | POA: Insufficient documentation

## 2014-02-13 DIAGNOSIS — F319 Bipolar disorder, unspecified: Secondary | ICD-10-CM | POA: Insufficient documentation

## 2014-02-13 DIAGNOSIS — Z8739 Personal history of other diseases of the musculoskeletal system and connective tissue: Secondary | ICD-10-CM | POA: Insufficient documentation

## 2014-02-13 DIAGNOSIS — Z760 Encounter for issue of repeat prescription: Secondary | ICD-10-CM | POA: Insufficient documentation

## 2014-02-13 HISTORY — DX: Major depressive disorder, single episode, unspecified: F32.9

## 2014-02-13 HISTORY — DX: Other specified behavioral and emotional disorders with onset usually occurring in childhood and adolescence: F98.8

## 2014-02-13 HISTORY — DX: Depression, unspecified: F32.A

## 2014-02-13 LAB — RAPID URINE DRUG SCREEN, HOSP PERFORMED
AMPHETAMINES: NOT DETECTED
BARBITURATES: NOT DETECTED
BENZODIAZEPINES: NOT DETECTED
Cocaine: NOT DETECTED
OPIATES: POSITIVE — AB
TETRAHYDROCANNABINOL: NOT DETECTED

## 2014-02-13 NOTE — ED Notes (Signed)
Pt states hx of depression and bipolar disorder.  Pt states he supposed to take Ritalin, Lamictal, and Prozac.  Pt states he is out of all meds.  Pt has been unable to set up appt with psychiatrist.  Pt recently got Medicaid.  Pt denies any thoughts of harming himself or others.  Pt states he's started a new job and wants to make sure he has his meds.

## 2014-02-13 NOTE — ED Notes (Signed)
Per [place], PA, patient needs "medical clearance" and will need to be moved out of fast track.

## 2014-02-13 NOTE — ED Provider Notes (Signed)
Medical screening examination/treatment/procedure(s) were performed by non-physician practitioner and as supervising physician I was immediately available for consultation/collaboration.   EKG Interpretation None        Gwyneth SproutWhitney Chasmine Lender, MD 02/13/14 2348

## 2014-02-13 NOTE — ED Notes (Addendum)
Patient states has been off medication for behavioral health for about a year.   Patient states he didn't have insurance and was unable to follow up with psychiatrist.   Patient states now has medicaid and is still having trouble getting in to see a psychiatrist.   Patient states is starting a job tomorrow and needs to get medications on board.   Patient denies any physical issues today.  States he just needs to be treated [facility] problems.

## 2014-02-13 NOTE — ED Notes (Addendum)
Pt walked out of ED with family member, RN tried to advise pt to wait for paperwork but pt continued to walk out. Respirations e/u. Family with pt.

## 2014-02-13 NOTE — ED Notes (Addendum)
Pt wife walked out of room and stated that pt would go to [facility] for [facility], advised pt that we would speak with PA abigail and that she would work on discharge paperwork. Pt stated he would wait for papers.

## 2014-02-13 NOTE — ED Provider Notes (Signed)
CSN: [id number]     Arrival date & time 02/13/14  1322 History   First MD Initiated Contact with Patient 02/13/14 1535     This chart was scribed for non-physician practitioner, Arthor CaptainAbigail Porsha Skilton, PA-C,  working with Gwyneth SproutWhitney Plunkett, MD by Arlan OrganAshley Leger, ED Scribe. This patient was seen in room TR06C/TR06C and the patient's care was started at 4:15 PM.   Chief Complaint  Patient presents with  . Medication Refill   The history is provided by the patient. No language interpreter was used.    HPI Comments: [name] is a 43 y.o. male with a PMHx of depression, ADD, Bipolar disorder, and COPD who presents to the Emergency Department for multiple medication refills today. He states he is currently prescribed Ritalin, Lamictal, and Prosac, however, he is currently out of all medications. States he has been off of his medications for about 1 year now. Denies any psychological issues or need for acute care since stopping medications.  He has been unable to follow up with his psychiatrist. Recently signed up for instate medicaid about 2 months ago. He states he is due to report to a new job starting tomorrow morning and is concerns about his ADD symptoms. At this time he denies any thought of harming himself or others. Pt has no other concerns this visit.  Past Medical History  Diagnosis Date  . COPD (chronic obstructive pulmonary disease)   . Arthritis   . Seizures     only once 12/30/2012  . Depression   . Bipolar disorder   . ADD (attention deficit disorder)    Past Surgical History  Procedure Laterality Date  . Mandible fracture surgery    . Appendectomy     No family history on file. History  Substance Use Topics  . Smoking status: Current Every Day Smoker -- 1.00 packs/day    Types: Cigarettes  . Smokeless tobacco: Not on file  . Alcohol Use: No    Review of Systems  Constitutional: Negative for fever and chills.  HENT: Negative for congestion.   Eyes: Negative for redness.   Respiratory: Negative for cough.   Skin: Negative for rash.  Psychiatric/Behavioral: Negative for suicidal ideas.      Allergies  Abilify and Benadryl  Home Medications   Prior to Admission medications   Medication Sig Start Date End Date Taking? Authorizing Provider  Omega-3 Fatty Acids (FISH OIL PO) Take 1 tablet by mouth daily.   Yes Historical Provider, MD  lamoTRIgine (LAMICTAL) 25 MG tablet Take 1 tablet (25 mg total) by mouth daily. For mood stabilization 01/09/13   Sanjuana KavaAgnes I Nwoko, NP  methylphenidate (RITALIN) 10 MG tablet Take 2 tablets twice daily: For ADHD 01/09/13   Sanjuana KavaAgnes I Nwoko, NP  traZODone (DESYREL) 100 MG tablet Take 1 tablet (100 mg total) by mouth at bedtime and may repeat dose one time if needed. For depression/sleep 01/09/13   Sanjuana KavaAgnes I Nwoko, NP    Triage Vitals: BP 134/63  Pulse 68  Temp(Src) 98.2 F (36.8 C) (Oral)  Resp 16  SpO2 97%    Physical Exam  Nursing note and vitals reviewed. Constitutional: He is oriented to person, place, and time. He appears well-developed and well-nourished.  HENT:  Head: Normocephalic and atraumatic.  Eyes: EOM are normal.  Neck: Normal range of motion.  Cardiovascular: Normal rate, regular rhythm and normal heart sounds.  Exam reveals no gallop and no friction rub.   No murmur heard. Pulmonary/Chest: Effort normal and breath sounds normal.  No respiratory distress. He has no wheezes. He has no rales.  Abdominal: Soft. Bowel sounds are normal. He exhibits no distension and no mass. There is no tenderness. There is no rebound and no guarding.  Musculoskeletal: Normal range of motion.  Neurological: He is alert and oriented to person, place, and time.  Skin: Skin is warm and dry.  Psychiatric: He has a normal mood and affect. His behavior is normal.    ED Course  Procedures (including critical care time)  DIAGNOSTIC STUDIES: Oxygen Saturation is 97% on RA, Normal by my interpretation.    COORDINATION OF CARE: 4:21 PM-  Advised pt to establish and follow up with a PCP regarding concerns and continued medication refills. Discussed treatment plan with pt at bedside and pt agreed to plan.     Labs Review Labs Reviewed  URINE RAPID DRUG SCREEN (HOSP PERFORMED)    Imaging Review No results found.   EKG Interpretation None      MDM   Final diagnoses:  None   Discussed that was used medications could not be refilled in the emergency department. I did offer him a consult with care management for establishment with primary care doctor here who might help him with his medications. Patient left  before consult and discharge .  I personally performed the services described in this documentation, which was scribed in my presence. The recorded information has been reviewed and is accurate.      Arthor CaptainAbigail Khalila Buechner, PA-C 02/13/14 1709

## 2014-02-14 ENCOUNTER — Encounter (HOSPITAL_COMMUNITY): Payer: Self-pay | Admitting: *Deleted

## 2014-02-14 ENCOUNTER — Encounter (HOSPITAL_COMMUNITY): Payer: Self-pay | Admitting: Emergency Medicine

## 2014-02-14 ENCOUNTER — Emergency Department (HOSPITAL_COMMUNITY)
Admission: EM | Admit: 2014-02-14 | Discharge: 2014-02-14 | Disposition: A | Discharge status: Home / Self Care | Payer: MEDICAID | Attending: Emergency Medicine | Admitting: Emergency Medicine

## 2014-02-14 ENCOUNTER — Inpatient Hospital Stay (HOSPITAL_COMMUNITY)
Admission: AD | Admit: 2014-02-14 | Discharge: 2014-02-19 | DRG: 885 | Disposition: A | Discharge status: Home / Self Care | Payer: 59 | Source: Intra-hospital | Attending: Psychiatry | Admitting: Psychiatry

## 2014-02-14 DIAGNOSIS — R45851 Suicidal ideations: Secondary | ICD-10-CM | POA: Insufficient documentation

## 2014-02-14 DIAGNOSIS — Z8669 Personal history of other diseases of the nervous system and sense organs: Secondary | ICD-10-CM | POA: Diagnosis not present

## 2014-02-14 DIAGNOSIS — J4489 Other specified chronic obstructive pulmonary disease: Secondary | ICD-10-CM | POA: Diagnosis present

## 2014-02-14 DIAGNOSIS — J449 Chronic obstructive pulmonary disease, unspecified: Secondary | ICD-10-CM | POA: Diagnosis not present

## 2014-02-14 DIAGNOSIS — F17200 Nicotine dependence, unspecified, uncomplicated: Secondary | ICD-10-CM | POA: Diagnosis not present

## 2014-02-14 DIAGNOSIS — F3289 Other specified depressive episodes: Secondary | ICD-10-CM | POA: Insufficient documentation

## 2014-02-14 DIAGNOSIS — M129 Arthropathy, unspecified: Secondary | ICD-10-CM | POA: Diagnosis present

## 2014-02-14 DIAGNOSIS — F329 Major depressive disorder, single episode, unspecified: Principal | ICD-10-CM | POA: Diagnosis present

## 2014-02-14 DIAGNOSIS — Z79899 Other long term (current) drug therapy: Secondary | ICD-10-CM | POA: Diagnosis not present

## 2014-02-14 DIAGNOSIS — F4310 Post-traumatic stress disorder, unspecified: Secondary | ICD-10-CM | POA: Diagnosis present

## 2014-02-14 DIAGNOSIS — F32A Depression, unspecified: Secondary | ICD-10-CM

## 2014-02-14 DIAGNOSIS — F319 Bipolar disorder, unspecified: Secondary | ICD-10-CM | POA: Diagnosis present

## 2014-02-14 DIAGNOSIS — F411 Generalized anxiety disorder: Secondary | ICD-10-CM | POA: Diagnosis present

## 2014-02-14 DIAGNOSIS — Z008 Encounter for other general examination: Secondary | ICD-10-CM | POA: Diagnosis present

## 2014-02-14 DIAGNOSIS — Z87820 Personal history of traumatic brain injury: Secondary | ICD-10-CM

## 2014-02-14 DIAGNOSIS — F909 Attention-deficit hyperactivity disorder, unspecified type: Secondary | ICD-10-CM | POA: Diagnosis present

## 2014-02-14 LAB — COMPREHENSIVE METABOLIC PANEL
ALK PHOS: 52 U/L (ref 39–117)
ALT: 21 U/L (ref 0–53)
AST: 20 U/L (ref 0–37)
Albumin: 4.3 g/dL (ref 3.5–5.2)
BUN: 13 mg/dL (ref 6–23)
CO2: 24 mEq/L (ref 19–32)
Calcium: 9.7 mg/dL (ref 8.4–10.5)
Chloride: 101 mEq/L (ref 96–112)
Creatinine, Ser: 1.02 mg/dL (ref 0.50–1.35)
GFR calc Af Amer: >90 mL/min (ref >90)
GFR calc non Af Amer: >90 mL/min (ref >90)
Glucose, Bld: 99 mg/dL (ref 70–99)
POTASSIUM: 4.1 meq/L (ref 3.7–5.3)
SODIUM: 140 meq/L (ref 137–147)
TOTAL PROTEIN: 7.6 g/dL (ref 6.0–8.3)
Total Bilirubin: 0.6 mg/dL (ref 0.3–1.2)

## 2014-02-14 LAB — CBC
HCT: 44.1 % (ref 39.0–52.0)
Hemoglobin: 15.3 g/dL (ref 13.0–17.0)
MCH: 31.3 pg (ref 26.0–34.0)
MCHC: 34.7 g/dL (ref 30.0–36.0)
MCV: 90.2 fL (ref 78.0–100.0)
Platelets: 211 10*3/uL (ref 150–400)
RBC: 4.89 MIL/uL (ref 4.22–5.81)
RDW: 13.8 % (ref 11.5–15.5)
WBC: 5.4 10*3/uL (ref 4.0–10.5)

## 2014-02-14 LAB — RAPID URINE DRUG SCREEN, HOSP PERFORMED
Amphetamines: NOT DETECTED
BARBITURATES: NOT DETECTED
BENZODIAZEPINES: NOT DETECTED
COCAINE: NOT DETECTED
Opiates: POSITIVE — AB
Tetrahydrocannabinol: NOT DETECTED

## 2014-02-14 LAB — ETHANOL

## 2014-02-14 MED ORDER — MAGNESIUM HYDROXIDE 400 MG/5ML PO SUSP: 30.0000 mL | Freq: Every day | ORAL | Status: DC | PRN

## 2014-02-14 MED ORDER — ZOLPIDEM TARTRATE 5 MG PO TABS: 5.0000 mg | ORAL_TABLET | Freq: Every evening | ORAL | Status: DC | PRN

## 2014-02-14 MED ORDER — ACETAMINOPHEN 325 MG PO TABS: 650.0000 mg | ORAL_TABLET | Freq: Four times a day (QID) | ORAL | Status: DC | PRN

## 2014-02-14 MED ORDER — TRAZODONE HCL 50 MG PO TABS
50.0000 mg | ORAL_TABLET | Freq: Every evening | ORAL | Status: DC | PRN
  Administered 2014-02-15: 50 mg via ORAL
  Filled 2014-02-14: qty 1

## 2014-02-14 MED ORDER — NICOTINE 21 MG/24HR TD PT24
21.0000 mg | MEDICATED_PATCH | Freq: Every day | TRANSDERMAL | Status: DC | PRN
  Filled 2014-02-14: qty 1

## 2014-02-14 MED ORDER — ACETAMINOPHEN 325 MG PO TABS: 650.0000 mg | ORAL_TABLET | ORAL | Status: DC | PRN

## 2014-02-14 MED ORDER — ALUM & MAG HYDROXIDE-SIMETH 200-200-20 MG/5ML PO SUSP: 30.0000 mL | ORAL | Status: DC | PRN

## 2014-02-14 MED ORDER — ALUM & MAG HYDROXIDE-SIMETH 200-200-20 MG/5ML PO SUSP
30.0000 mL | ORAL | Status: DC | PRN
  Administered 2014-02-18: 30 mL via ORAL

## 2014-02-14 MED ORDER — IBUPROFEN 400 MG PO TABS
400.0000 mg | ORAL_TABLET | Freq: Three times a day (TID) | ORAL | Status: DC | PRN
  Administered 2014-02-14: 400 mg via ORAL
  Filled 2014-02-14: qty 1

## 2014-02-14 MED ORDER — ONDANSETRON HCL 4 MG PO TABS: 4.0000 mg | ORAL_TABLET | Freq: Three times a day (TID) | ORAL | Status: DC | PRN

## 2014-02-14 MED ORDER — LORAZEPAM 1 MG PO TABS
1.0000 mg | ORAL_TABLET | Freq: Three times a day (TID) | ORAL | Status: DC | PRN
  Administered 2014-02-14: 1 mg via ORAL
  Filled 2014-02-14: qty 1

## 2014-02-14 MED ORDER — LORAZEPAM 0.5 MG PO TABS
0.5000 mg | ORAL_TABLET | Freq: Three times a day (TID) | ORAL | Status: DC | PRN
  Administered 2014-02-15 – 2014-02-17 (×6): 0.5 mg via ORAL
  Filled 2014-02-14 (×6): qty 1

## 2014-02-14 NOTE — ED Notes (Signed)
Pt requesting additional food, extra meal given.

## 2014-02-14 NOTE — ED Notes (Signed)
[facility] transportation called
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
## 2014-02-14 NOTE — Progress Notes (Signed)
CM spoke with patient regarding ED visit and Medicaid status with no documented PCP. Patient stated that he has recently acquired his Medicaid in the past 2 months and has not yet established a PCP. Discussed the importance of establishing a PCP to follow with his overall health and history of COPD. Provided the patient with a resource list of physicians in [place] that accept [place] patients. Also provided the patient with a list of community resources including information for DSS. Patient stated that he understands and was appreciative of the information. Ferdinand CavaAndrea Schettino, RN, BSN, Case Managers 02/14/2014 3:17 PM

## 2014-02-14 NOTE — ED Notes (Signed)
[facility]
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
## 2014-02-14 NOTE — ED Provider Notes (Signed)
4:01 PM Patient accepted to behavioral health. Accepting physician is Dr. Addison NaegeliJonalagadda.  Audree CamelScott T Jibran Crookshanks, MD 02/14/14 [phone]

## 2014-02-14 NOTE — Progress Notes (Signed)
Patient ID: Bradly BienenstockMarcus Pinon, male   DOB: 1971/05/30, 43 y.o.   MRN: [id number] D: Pt. Irritable, asking what medications had been ordered. A: Writer introduced self to client provided emotional support, reviewed medication administration schedule. Data processing managerWriter encouraged karaoke. Staff will monitor q4715min for safety. R: pt. Is safe on the unit, refused karaoke.

## 2014-02-14 NOTE — Progress Notes (Signed)
Psychoeducational Group Note  Date:  02/14/2014 Time:  8:00 p.m.   Group Topic/Focus:  Wrap-Up Group:   The focus of this group is to help patients review their daily goal of treatment and discuss progress on daily workbooks.  Participation Level: Did Not Attend  Participation Quality:  Not Applicable  Affect:  Not Applicable  Cognitive:  Not Applicable  Insight:  Not Applicable  Engagement in Group: Not Applicable  Additional Comments:  The patient did not attend group since he was asleep.   Westly PamBenjamin S Ndea Kilroy 02/14/2014, 10:21 PM

## 2014-02-14 NOTE — ED Notes (Signed)
[facility] [place]. ED MD to fill out EMTALA.

## 2014-02-14 NOTE — BH Assessment (Signed)
[facility] Assessment Progress Note  At 10:33 I spoke to EDP Dr Clarene DukeMcManus in anticipation of TTS assessment.  Doylene Canninghomas Jye Fariss, MA Triage Specialist 02/14/2014 @ 10:37
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
## 2014-02-14 NOTE — ED Notes (Signed)
[name] arrived, belongings given to [name], pt acknowledges all belongings. Acceptance of treatment form signed by patient and [facility], copy given to transport.

## 2014-02-14 NOTE — Progress Notes (Signed)
Pt. Is a 43 year old male with a stated history of bipolar disorder, ADHD, depression, anxiety.  He presents stating that he has not taken his medication in ~8 months, and when things began to spiral out of control for him, he became suicidal.  Pt. States he made multiple attempts to see a physician but was unsuccessful.  Pt. States this is not [facility].  He denies any alcohol or drug use and states that he does have support at home.

## 2014-02-15 ENCOUNTER — Encounter (HOSPITAL_COMMUNITY): Payer: Self-pay | Admitting: Psychiatry

## 2014-02-15 MED ORDER — LAMOTRIGINE 25 MG PO TABS
25.0000 mg | ORAL_TABLET | Freq: Every day | ORAL | Status: DC
  Administered 2014-02-15 – 2014-02-18 (×4): 25 mg via ORAL
  Filled 2014-02-15 (×6): qty 1

## 2014-02-15 MED ORDER — PRAZOSIN HCL 1 MG PO CAPS
1.0000 mg | ORAL_CAPSULE | Freq: Every day | ORAL | Status: DC
  Administered 2014-02-15 – 2014-02-18 (×4): 1 mg via ORAL
  Filled 2014-02-15 (×2): qty 1
  Filled 2014-02-15: qty 4
  Filled 2014-02-15 (×3): qty 1

## 2014-02-15 MED ORDER — NICOTINE 21 MG/24HR TD PT24
MEDICATED_PATCH | TRANSDERMAL | Status: AC
  Filled 2014-02-15: qty 1

## 2014-02-15 MED ORDER — TRAZODONE HCL 100 MG PO TABS
100.0000 mg | ORAL_TABLET | Freq: Every evening | ORAL | Status: DC | PRN
  Administered 2014-02-15 – 2014-02-18 (×4): 100 mg via ORAL
  Filled 2014-02-15 (×4): qty 1
  Filled 2014-02-15: qty 8

## 2014-02-15 MED ORDER — NICOTINE 21 MG/24HR TD PT24
21.0000 mg | MEDICATED_PATCH | Freq: Every day | TRANSDERMAL | Status: DC
  Administered 2014-02-15 – 2014-02-19 (×5): 21 mg via TRANSDERMAL
  Filled 2014-02-15 (×7): qty 1

## 2014-02-15 MED ORDER — TRAMADOL HCL 50 MG PO TABS
50.0000 mg | ORAL_TABLET | Freq: Four times a day (QID) | ORAL | Status: DC | PRN
  Administered 2014-02-15 – 2014-02-19 (×13): 50 mg via ORAL
  Filled 2014-02-15 (×13): qty 1

## 2014-02-15 MED ORDER — METHYLPHENIDATE HCL 10 MG PO TABS
10.0000 mg | ORAL_TABLET | Freq: Two times a day (BID) | ORAL | Status: DC
  Administered 2014-02-15 – 2014-02-18 (×7): 10 mg via ORAL
  Filled 2014-02-15 (×7): qty 1

## 2014-02-15 NOTE — [facility] Suicide Risk Assessment (Signed)
Suicide Risk Assessment  Admission Assessment     Nursing information obtained from:    Demographic factors:    Current Mental Status:    Loss Factors:    Historical Factors:    Risk Reduction Factors:    Total Time spent with patient: 1 hour  CLINICAL FACTORS:   Depression:   Insomnia  PCOGNITIVE FEATURES THAT CONTRIBUTE TO RISK:  Closed-mindedness Polarized thinking Thought constriction (tunnel vision)    SUICIDE RISK:   Moderate:  Frequent suicidal ideation with limited intensity, and duration, some specificity in terms of plans, no associated intent, good self-control, limited dysphoria/symptomatology, some risk factors present, and identifiable protective factors, including available and accessible social support.  PLAN OF CARE: Supportive approach/coping skills                               CBT;mindfulness                               Resume medications  I certify that inpatient services furnished can reasonably be expected to improve the patient's condition.  Rachael Feerving A Tayron Hunnell 02/15/2014, 5:28 PM

## 2014-02-15 NOTE — Progress Notes (Signed)
D: Pt denies SI/HI/AVH. Pt is pleasant and cooperative. Pt Just  Got approved for medicaid so he will be able to get to see a Dr. Rock NephewPt said he will start seeing Dr. Mervyn SkeetersA for out patient.   A: Pt was offered support and encouragement. Pt was given scheduled medications. Pt was encourage to attend groups. Q 15 minute checks were done for safety.   R:Pt attends groups and interacts well with peers and staff. Pt is taking medication. Pt has no complaints at this time.Pt receptive to treatment and safety maintained on unit.

## 2014-02-15 NOTE — Progress Notes (Signed)
[facility] Group Notes:  (Nursing/MHT/Case Management/Adjunct)  Date:  02/15/2014  Time:  10:00 PM  Type of Therapy:  Group Therapy  Participation Level:  Minimal  Participation Quality:  Appropriate  Affect:  Appropriate  Cognitive:  Appropriate  Insight:  Appropriate  Engagement in Group:  Developing/Improving  Modes of Intervention:  Socialization and Support  Summary of Progress/Problems: Pt. Was engaged in group.  Pt. Stated anger and short attention span as early warning signs of relapse.  Sondra ComeRyan J Colon Rueth 02/15/2014, 10:00 PM

## 2014-02-15 NOTE — [facility] Suicide Risk Assessment (Signed)
[facility] INPATIENT:  Family/Significant Other Suicide Prevention Education  Suicide Prevention Education:  Patient Refusal for Family/Significant Other Suicide Prevention Education: The patient [name] has refused to provide written consent for family/significant other to be provided Family/Significant Other Suicide Prevention Education during admission and/or prior to discharge.  Physician notified.  [name] 02/15/2014, 3:00 PM

## 2014-02-15 NOTE — Progress Notes (Signed)
D:  Patient's self inventory sheet, patient has poor sleep, improving appetite, low energy level, poor attention span.  Rated depression 5, hopeless 4, anxiety 6.  SI off/on, contracts for safety.  Has experienced back pain in past 24 hours.  No discharge plans.  No problems taking medications after discharge. A:  Medications administered per MD orders.  Emotional support and encouragement given patient. R:  Denied HI.  SI off/on, contracts for safety.  Denied A/V hallucinations.  Will continue to monitor patient for safety with 15 minute checks.  Safety maintained.
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
## 2014-02-15 NOTE — [facility] Group Notes (Signed)
[facility] LCSW Group Therapy  Feelings Around Relapse 1:15 -2:30        02/15/2014 2:59 PM   Type of Therapy:  Group Therapy  Participation Level:  Did not attend group.    Wynn BankerHodnett, Baxter Gonzalez Hairston 02/15/2014  2:59 PM
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
## 2014-02-16 ENCOUNTER — Encounter (HOSPITAL_COMMUNITY): Payer: Self-pay | Admitting: Registered Nurse

## 2014-02-16 NOTE — Progress Notes (Signed)
Agree with above assessment and plan.  Auryn Paige P Cassidi Modesitt, MD  

## 2014-02-16 NOTE — Progress Notes (Signed)
Adult Psychoeducational Group Note  Date:  02/16/2014 Time:  5:18 PM  Group Topic/Focus:  Healthy Communication:   The focus of this group is to discuss communication, barriers to communication, as well as healthy ways to communicate with others.  Participation Level:  Active  Participation Quality:  Attentive  Affect:  Appropriate  Cognitive:  Oriented  Insight: Appropriate  Engagement in Group:  Engaged  Modes of Intervention:  Discussion, Socialization and Support  [name] 02/16/2014, 5:18 PM
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
## 2014-02-16 NOTE — Progress Notes (Signed)
Psychoeducational Group Note  Date: 02/16/2014 Time:  1015  Group Topic/Focus:  Identifying Needs:   The focus of this group is to help patients identify their personal needs that have been historically problematic and identify healthy behaviors to address their needs.  Participation Level:  Active  Participation Quality:  Attentive  Affect:  Flat  Cognitive:  Oriented  Insight:  Improving  Engagement in Group:  Engaged  Additional Comments:  Pt attended the group and partisipated  Curley Fayette A 

## 2014-02-16 NOTE — Progress Notes (Signed)
D   Pt was heard by another pt promising his bedtime medications to another male pt who has been very med seeking    He has otherwise been cooperative and pleasant  He dose continue to have some anxiety   He is interactive with select others A   Verbal support given   Medications administered and effectiveness monitored   Q 15 min checks R   Pt safe at present

## 2014-02-16 NOTE — Progress Notes (Signed)
.  Psychoeducational Group Note    Date: 02/16/2014 Time: 0930  Goal Setting Purpose of Group: To be able to set a goal that is measurable and that can be accomplished in one day Participation Level:  Active  Participation Quality:  Appropriate  Affect:  Appropriate  Cognitive:  Oriented  Insight:  Improving  Engagement in Group:  Engaged  Additional Comments:  Participating and engaged in the group.  Cassidi Modesitt A  

## 2014-02-16 NOTE — [facility] Group Notes (Signed)
[facility] LCSW Group Therapy  02/16/2014 4:19 PM  Type of Therapy:  Group Therapy  Participation Level:  Active  Participation Quality:  Attentive, Sharing and Supportive  Affect:  Depressed  Cognitive:  Alert and Oriented  Insight:  Developing/Improving  Engagement in Therapy:  Developing/Improving  Modes of Intervention:  Discussion, Exploration, Problem-solving, Rapport Building, Socialization and Support  Summary of Progress/Problems: The main focus of today's process group was to identify the patient's current support system and decide on other supports that can be put in place. An emphasis was placed on using counselor, doctor, therapy groups, 12-step groups, and problem-specific support groups to expand supports, as well as doing something different than has been done before.   [name] discussed past experiences in which he stated his family and others were initially seen as supports until further evaluation by himself. [name] stated that his father often discouraged him from discussing his mental health issues which subsequently caused [name] to have reluctance towards communicating and receiving support from others. Patient reported that he identifies his friends now to be positive supports in the absence of his father. Patient ended group reporting the benefits of having others who can provide guidance and support during times of depression and isolation.   Corey KhanGregory C Pickett Jr. 02/16/2014, 4:19 PM

## 2014-02-16 NOTE — Progress Notes (Signed)
The focus of this group is to help patients review their daily goal of treatment and discuss progress on daily workbooks. Pt did not attend the evening group. 

## 2014-02-17 DIAGNOSIS — R45851 Suicidal ideations: Secondary | ICD-10-CM

## 2014-02-17 DIAGNOSIS — F39 Unspecified mood [affective] disorder: Secondary | ICD-10-CM

## 2014-02-17 DIAGNOSIS — F909 Attention-deficit hyperactivity disorder, unspecified type: Secondary | ICD-10-CM

## 2014-02-17 NOTE — Progress Notes (Signed)
Psychoeducational Group Note  Date: 02/17/2014 Time:  0930   Group Topic/Focus:  Gratefulness:  The focus of this group is to help patients identify what two things they are most grateful for in their lives. What helps ground them and to center them on their work to their recovery.  Participation Level:  Active  Participation Quality:  Appropriate  Affect:  Appropriate  Cognitive:  Oriented  Insight:  Improving  Engagement in Group:  Engaged  Additional Comments:  Pt. participated in the group.  Hiroshi Krummel A   

## 2014-02-17 NOTE — [facility] Group Notes (Signed)
[facility] LCSW Group Therapy Note  02/17/2014 / 1:15 PM  Type of Therapy and Topic:  Group Therapy: Avoiding Self-Sabotaging and Enabling Behaviors  Participation Level:  Active   Mood: Flat  Description of Group:     Learn how to identify obstacles, self-sabotaging and enabling behaviors, what are they, why do we do them and what needs do these behaviors meet? Discuss unhealthy relationships and how to have positive healthy boundaries with those that sabotage and enable. Explore aspects of self-sabotage and enabling in yourself and how to limit these self-destructive behaviors in everyday life.  Therapeutic Goals: 1. Patient will identify one obstacle that relates to self-sabotage and enabling behaviors 2. Patient will identify one personal self-sabotaging or enabling behavior they did prior to admission 3. Patient able to establish a plan to change the above identified behavior they did prior to admission:  4. Patient will demonstrate ability to communicate their needs through discussion and/or role plays.   Summary of Patient Progress: The main focus of today's process group was to discuss what "self-sabotage" means and use Motivational Interviewing to discuss what benefits, negative or positive, were involved in a self-identified self-sabotaging behavior. We then talked about reasons the patient may want to change the behavior and her current desire to change. Patient shared during warm up that he is looking forward to a beach trip and seeing a new doctor for follow up this year. Patient shared that he is willing to make one significant change to promote self care upon discharge and that change is to engage in some type supportive community/support group.   Therapeutic Modalities:   Cognitive Behavioral Therapy Person-Centered Therapy Motivational Interviewing   Carney Bernatherine C Harrill, LCSW
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
## 2014-02-17 NOTE — Progress Notes (Signed)
Psychoeducational Group Note  Date:  02/17/2014 Time:  1015  Group Topic/Focus:  Making Healthy Choices:   The focus of this group is to help patients identify negative/unhealthy choices they were using prior to admission and identify positive/healthier coping strategies to replace them upon discharge.  Participation Level:  Active  Participation Quality:  Appropriate  Affect:  Appropriate  Cognitive:  Oriented  Insight:  Improving  Engagement in Group:  Engaged  Additional Comments:  Attended the group and partisapated  Lynnox Girten A 02/17/2014  

## 2014-02-18 MED ORDER — METHYLPHENIDATE HCL 10 MG PO TABS
15.0000 mg | ORAL_TABLET | Freq: Two times a day (BID) | ORAL | Status: DC
  Administered 2014-02-19 (×2): 15 mg via ORAL
  Filled 2014-02-18 (×2): qty 2

## 2014-02-18 MED ORDER — METHYLPHENIDATE HCL 10 MG PO TABS
5.0000 mg | ORAL_TABLET | Freq: Once | ORAL | Status: AC
  Administered 2014-02-18: 5 mg via ORAL
  Filled 2014-02-18: qty 1

## 2014-02-18 MED ORDER — LAMOTRIGINE 25 MG PO TABS
25.0000 mg | ORAL_TABLET | Freq: Two times a day (BID) | ORAL | Status: DC
  Administered 2014-02-18 – 2014-02-19 (×2): 25 mg via ORAL
  Filled 2014-02-18: qty 8
  Filled 2014-02-18 (×4): qty 1
  Filled 2014-02-18: qty 8

## 2014-02-18 MED ORDER — GABAPENTIN 100 MG PO CAPS
200.0000 mg | ORAL_CAPSULE | Freq: Three times a day (TID) | ORAL | Status: DC
  Administered 2014-02-18 – 2014-02-19 (×3): 200 mg via ORAL
  Filled 2014-02-18 (×2): qty 2
  Filled 2014-02-18: qty 24
  Filled 2014-02-18 (×3): qty 2
  Filled 2014-02-18 (×2): qty 24

## 2014-02-18 NOTE — Progress Notes (Signed)
Patient ID: [name], male   DOB: 1971/01/03, 43 y.o.   MRN: [id number]  D: Pt. Denies SI/HI and A/V Hallucinations. Patient rates his depression and hopelessness at 0/10 for the day. Patient did report some lower back pain but reported relief after given PRN Tramadol.  A: Support and encouragement provided to the patient. Scheduled medications given to patient per physician's orders.  R: Patient was irritable upon first interaction with this writer but mood has improved throughout the day. Patient is seen in the milieu and is attending groups. Q15 minute checks are maintained for safety.

## 2014-02-18 NOTE — [facility] Group Notes (Signed)
[facility]   02/18/2014 8:45 AM  Participation Quality:  Alert, Appropriate and Oriented  Mood/Affect:  Calm  Depression Rating:  0  Anxiety Rating:  0  Thoughts of Suicide:  Pt denies SI/HI  Will you contract for safety?   Yes  Current AVH:  Pt denies  Plan for Discharge/Comments:  Pt attended discharge planning group and actively participated in group.  CSW provided pt with today's workbook.  Pt states that he's "almost ready to d/c".  [place] and [place] that [place] plans to go back to work.  Pt will follow up at [facility] for outpatient medication management and therapy. No further needs voiced by pt at this time.    Transportation Means: Pt reports access to transportation  Supports: No supports mentioned at this time  Reyes IvanChelsea Horton, LCSW 02/18/2014 10:02 AM

## 2014-02-18 NOTE — [facility] Group Notes (Signed)
[facility] LCSW Group Therapy  02/18/2014   1:15 PM   Type of Therapy:  Group Therapy  Participation Level:  Active  Participation Quality:  Attentive, Sharing and Supportive  Affect:  Depressed and Flat  Cognitive:  Alert and Oriented  Insight:  Developing/Improving and Engaged  Engagement in Therapy:  Developing/Improving and Engaged  Modes of Intervention:  Clarification, Confrontation, Discussion, Education, Exploration, Limit-setting, Orientation, Problem-solving, Rapport Building, Dance movement psychotherapisteality Testing, Socialization and Support  Summary of Progress/Problems: Pt identified obstacles faced currently and processed barriers involved in overcoming these obstacles. Pt identified steps necessary for overcoming these obstacles and explored motivation (internal and external) for facing these difficulties head on. Pt further identified one area of concern in their lives and chose a goal to focus on for today.  Pt shared that his biggest obstacle was/is disconnecting from his family.  Pt was able to process how the relationship with his family has been unhealthy and chaotic and how it was not beneficial to his recovery  Pt discussed ways to build new support through a therapist and support groups.  Pt actively participated and was engaged in group discussion.    Corey IvanChelsea Horton, LCSW 02/18/2014  3:39 PM

## 2014-02-18 NOTE — Progress Notes (Signed)
[facility] Group Notes:  (Nursing/MHT/Case Management/Adjunct)  Date:  02/18/2014  Time:  12:20 AM  Type of Therapy:  Group Therapy  Participation Level:  Active  Participation Quality:  Appropriate  Affect:  Appropriate  Cognitive:  Appropriate  Insight:  Appropriate  Engagement in Group:  Engaged  Modes of Intervention:  Socialization and Support  Summary of Progress/Problems: Pt. Was engaged in group discussion.  Pt. Was able to identify a conflict and work toward resolving his conflict.  Sondra ComeRyan J Jamion Carter 02/18/2014, 12:20 AM

## 2014-02-18 NOTE — Progress Notes (Signed)
[facility]  02/18/2014 7:32 PM Bradly BienenstockMarcus Stabler  MRN:  [id number] Subjective:  Corey SpareMarcus states that he is having increased pain in his back. Asks if Neurontin could help. He has also seen less benefit with the Ritalin 10 mg BID. States that when left last time and followed up with his outpatient doctor it was increased to 15 mg BID and this was more effective. He is concerned as states he is having a one shot to be able to keep this job. States he wants to be sure he is able to go back and be succesful Diagnosis:   DSM5: Schizophrenia Disorders:  none Obsessive-Compulsive Disorders:  none Trauma-Stressor Disorders:  Posttraumatic Stress Disorder (309.81) Substance/Addictive Disorders:  none Depressive Disorders:  Major Depressive Disorder - Moderate (296.22) Total Time spent with patient: 30 minutes  Axis I: ADHD, combined type  ADL's:  Intact  Sleep: Fair  Appetite:  Fair  Suicidal Ideation:  Plan:  denies Intent:  denies Means:  denies Homicidal Ideation:  Plan:  denies Intent:  denies Means:  denies AEB (as evidenced by):  Psychiatric Specialty Exam: Physical Exam  Review of Systems  Constitutional: Negative.   HENT: Negative.   Eyes: Negative.   Respiratory: Negative.   Gastrointestinal: Negative.   Genitourinary: Negative.   Musculoskeletal: Positive for back pain.  Skin: Negative.   Neurological: Negative.   Endo/Heme/Allergies: Negative.   Psychiatric/Behavioral: Positive for depression. The patient is nervous/anxious.     Blood pressure 110/69, pulse 91, temperature 97.6 F (36.4 C), temperature source Oral, resp. rate 18, height 6\' 1"  (1.854 m), weight 96.616 kg (213 lb), SpO2 98.00%.Body mass index is 28.11 kg/(m^2).  General Appearance: Fairly Groomed  Patent attorneyye Contact::  Fair  Speech:  Clear and Coherent  Volume:  Normal  Mood:  Anxious  Affect:  anxious, worried  Thought Process:  Coherent and Goal Directed  Orientation:  Full (Time, Place, and  Person)  Thought Content:  symptoms, worries, concerns  Suicidal Thoughts:  No  Homicidal Thoughts:  No  Memory:  Immediate;   Fair Recent;   Fair Remote;   Fair  Judgement:  Fair  Insight:  Present  Psychomotor Activity:  Restlessness  Concentration:  improved  Recall:  [employer]Fair  Fund of Knowledge:NA  Language: Fair  Akathisia:  No  Handed:    AIMS (if indicated):     Assets:  Desire for Improvement  Sleep:  Number of Hours: 6.75   Musculoskeletal: Strength & Muscle Tone: within normal limits Gait & Station: normal Patient leans: N/A  Current Medications: Current Facility-Administered Medications  Medication Dose Route Frequency Provider Last Rate Last Dose  . acetaminophen (TYLENOL) tablet 650 mg  650 mg Oral Q6H PRN Audrea MuscatEvanna Cori Burkett, NP      . alum & mag hydroxide-simeth (MAALOX/MYLANTA) 200-200-20 MG/5ML suspension 30 mL  30 mL Oral Q4H PRN Audrea MuscatEvanna Cori Burkett, NP   30 mL at 02/18/14 1634  . gabapentin (NEURONTIN) capsule 200 mg  200 mg Oral TID Rachael FeeIrving A Mathayus Stanbery, MD   200 mg at 02/18/14 1635  . lamoTRIgine (LAMICTAL) tablet 25 mg  25 mg Oral BID Rachael FeeIrving A Kerry Odonohue, MD   25 mg at 02/18/14 1635  . LORazepam (ATIVAN) tablet 0.5 mg  0.5 mg Oral TID PRN Audrea MuscatEvanna Cori Burkett, NP   0.5 mg at 02/17/14 1720  . magnesium hydroxide (MILK OF MAGNESIA) suspension 30 mL  30 mL Oral Daily PRN Evanna Janann Augustori Burkett, NP      . Melene Muller[START ON  02/19/2014] methylphenidate (RITALIN) tablet 15 mg  15 mg Oral BID WC Rachael FeeIrving A Fatimah Sundquist, MD      . nicotine (NICODERM CQ - dosed in mg/24 hours) patch 21 mg  21 mg Transdermal Q0600 Rachael FeeIrving A Leisel Pinette, MD   21 mg at 02/18/14 0835  . prazosin (MINIPRESS) capsule 1 mg  1 mg Oral QHS Rachael FeeIrving A Tawnia Schirm, MD   1 mg at 02/17/14 2103  . traMADol (ULTRAM) tablet 50 mg  50 mg Oral Q6H PRN Rachael FeeIrving A Domnique Vantine, MD   50 mg at 02/18/14 1301  . traZODone (DESYREL) tablet 100 mg  100 mg Oral QHS PRN,MR X 1 Rachael FeeIrving A Kamsiyochukwu Spickler, MD   100 mg at 02/17/14 2103    Lab Results: No results found for this or any  previous visit (from the past 48 hour(s)).  Physical Findings: AIMS: Facial and Oral Movements Muscles of Facial Expression: None, normal Lips and Perioral Area: None, normal Jaw: None, normal Tongue: None, normal,Extremity Movements Upper (arms, wrists, hands, fingers): None, normal Lower (legs, knees, ankles, toes): None, normal, Trunk Movements Neck, shoulders, hips: None, normal, Overall Severity Severity of abnormal movements (highest score from questions above): None, normal Incapacitation due to abnormal movements: None, normal Patient's awareness of abnormal movements (rate only patient's report): No Awareness, Dental Status Current problems with teeth and/or dentures?: No Does patient usually wear dentures?: No  CIWA:  CIWA-Ar Total: 2 COWS:  COWS Total Score: 2  Treatment Plan Summary: Daily contact with patient to assess and evaluate symptoms and progress in treatment Medication management  Plan: Supportive approach/coping skills           Trial with Neurontin 300 mg TID           Increase the Ritalin to 15 mg BID   Medical Decision Making Problem Points:  Review of psycho-social stressors (1) Data Points:  Review of medication regiment & side effects (2) Review of new medications or change in dosage (2)  I certify that inpatient services furnished can reasonably be expected to improve the patient's condition.   Rachael Feerving A Ainara Eldridge 02/18/2014, 7:32 PM

## 2014-02-18 NOTE — Progress Notes (Signed)
Writer spoke with patient 1:1 and he reported having a good day. He reports that his plans for discharge are to return home, attend outpatient with Dr. Jannifer FranklinAkintayo and will use his support system which consist of his family. He deneis si/hi/a/v hallucinations. He is compliant with his meds and hoping to discharge on Monday. . Support and encouragement given. Safety maintained on unit with 15 min checks.

## 2014-02-18 NOTE — Progress Notes (Signed)
[facility] Group Notes:  (Nursing/MHT/Case Management/Adjunct)  Date:  02/18/2014  Time:  8:00 p.m.   Type of Therapy:  Psychoeducational Skills  Participation Level:  Active  Participation Quality:  Appropriate  Affect:  Appropriate  Cognitive:  Appropriate  Insight:  Appropriate  Engagement in Group:  Engaged  Modes of Intervention:  Education  Summary of Progress/Problems: The patient shared in group tonight that he had a good day overall. He mentioned that he had a good talk with his doctor and that his medication was adjusted. He mentioned that he might be discharged tomorrow. As a theme for the day, he intends to work on setting a plan and following through with it.   Westly PamBenjamin S Aerilyn Slee 02/18/2014, 9:29 PM

## 2014-02-18 NOTE — Progress Notes (Signed)
Patient ID: Corey BienenstockMarcus Klein, male   DOB: 1970-11-25, 43 y.o.   MRN: [id number] D: pt. Visible in hallway and dayroom, interacts with staff and clients. Pt. Waiting in hall for writer at med window almost immediately to ask for pain medications. A: Writer reviewed medications and administered Ultram as prescribed (see MAR). Staff will monitor q8315min for safety. R: Pt. Is safe on the unit, attended group.

## 2014-02-18 NOTE — Progress Notes (Signed)
Pt approached the nurse's station and writer ask May I help you? Pt then stated "if I needed help I wouldn't get it from you because you are about to lose your job." Clinical research associateWriter then asked another staff to assist him. Pt then asked the other staff for the CEO.

## 2014-02-19 DIAGNOSIS — F4310 Post-traumatic stress disorder, unspecified: Secondary | ICD-10-CM

## 2014-02-19 MED ORDER — GABAPENTIN 100 MG PO CAPS: 200.0000 mg | ORAL_CAPSULE | Freq: Three times a day (TID) | ORAL | Status: DC

## 2014-02-19 MED ORDER — LAMOTRIGINE 25 MG PO TABS: 25.0000 mg | ORAL_TABLET | Freq: Two times a day (BID) | ORAL | Status: DC

## 2014-02-19 MED ORDER — METHYLPHENIDATE HCL 5 MG PO TABS: 15.0000 mg | ORAL_TABLET | Freq: Two times a day (BID) | ORAL | Status: DC

## 2014-02-19 MED ORDER — PRAZOSIN HCL 1 MG PO CAPS: 1.0000 mg | ORAL_CAPSULE | Freq: Every day | ORAL | Status: DC

## 2014-02-19 MED ORDER — TRAZODONE HCL 100 MG PO TABS: 100.0000 mg | ORAL_TABLET | Freq: Every evening | ORAL | Status: DC | PRN
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
## 2014-02-19 NOTE — Progress Notes (Signed)
The focus of this group is to educate the patient on the purpose and policies of crisis stabilization and provide a format to answer questions about their admission.  The group details unit policies and expectations of patients while admitted. Patient did attend group and was cooperative. The patient left the group early.

## 2014-02-19 NOTE — Progress Notes (Signed)
[facility] Discharge [facility] :  Will you be returning to the same living situation after discharge: Yes,  Patient is returning to his home. At discharge, do you have transportation home?:Yes,  Patient to arrange transportation home. Do you have the ability to pay for your medications:Yes,  Patient has Medicaid.  Release of information consent forms completed and in the chart;  Patient's signature needed at discharge.  Patient to Follow up at: Follow-up Information   Follow up with     [facility] On 03/19/2014. (Tuesday, May 19. 2015 at 2:10 PM)    Contact information:   [address] [place], [place]   [address]  [phone]      Patient denies SI/HI:   Patient no longer endorsing SI/HI or other thoughts of self harm.     Safety Planning and Suicide Prevention discussed:  .Reviewed with all patients during discharge planning group  Hari Casaus Hairston Aisling Emigh 02/19/2014, 12:02 PM

## 2014-02-19 NOTE — Progress Notes (Signed)
Patient ID: Corey BienenstockMarcus Klein, male   DOB: [birth year], 43 y.o.   MRN: [id number] Patient discharged per physician order; patient denies SI/HI and A/V hallucinations; patient received prescriptions and copy of AVS after it was reviewed; patient had no other questions or concerns at this time; patient verbalized and signed that he received all belongings; patient left the unit ambulatory; patient refused to take samples
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
## 2014-02-22 NOTE — Progress Notes (Signed)
Patient Discharge Instructions:  After Visit Summary (AVS):   Faxed to:  02/22/14 Discharge Summary Note:   Faxed to:  02/22/14 Psychiatric Admission Assessment Note:   Faxed to:  02/22/14 Suicide Risk Assessment - Discharge Assessment:   Faxed to:  02/22/14 Faxed/Sent to the Next Level Care provider:  02/22/14 Faxed to [facility] @ [phone]  Corey ReddenSheena E Klein, 02/22/2014, 3:04 PM

## 2014-02-23 ENCOUNTER — Encounter (HOSPITAL_COMMUNITY): Payer: Self-pay | Admitting: Emergency Medicine

## 2014-02-23 ENCOUNTER — Emergency Department (HOSPITAL_COMMUNITY)
Admission: EM | Admit: 2014-02-23 | Discharge: 2014-02-23 | Disposition: A | Discharge status: Home / Self Care | Payer: Medicaid Other | Attending: Emergency Medicine | Admitting: Emergency Medicine

## 2014-02-23 DIAGNOSIS — Z8739 Personal history of other diseases of the musculoskeletal system and connective tissue: Secondary | ICD-10-CM | POA: Insufficient documentation

## 2014-02-23 DIAGNOSIS — R209 Unspecified disturbances of skin sensation: Secondary | ICD-10-CM | POA: Insufficient documentation

## 2014-02-23 DIAGNOSIS — Y9389 Activity, other specified: Secondary | ICD-10-CM | POA: Insufficient documentation

## 2014-02-23 DIAGNOSIS — Z79899 Other long term (current) drug therapy: Secondary | ICD-10-CM | POA: Insufficient documentation

## 2014-02-23 DIAGNOSIS — F988 Other specified behavioral and emotional disorders with onset usually occurring in childhood and adolescence: Secondary | ICD-10-CM | POA: Insufficient documentation

## 2014-02-23 DIAGNOSIS — M549 Dorsalgia, unspecified: Secondary | ICD-10-CM

## 2014-02-23 DIAGNOSIS — F319 Bipolar disorder, unspecified: Secondary | ICD-10-CM | POA: Insufficient documentation

## 2014-02-23 DIAGNOSIS — Y929 Unspecified place or not applicable: Secondary | ICD-10-CM | POA: Insufficient documentation

## 2014-02-23 DIAGNOSIS — Y990 Civilian activity done for income or pay: Secondary | ICD-10-CM | POA: Insufficient documentation

## 2014-02-23 DIAGNOSIS — J4489 Other specified chronic obstructive pulmonary disease: Secondary | ICD-10-CM | POA: Insufficient documentation

## 2014-02-23 DIAGNOSIS — J449 Chronic obstructive pulmonary disease, unspecified: Secondary | ICD-10-CM | POA: Insufficient documentation

## 2014-02-23 DIAGNOSIS — T733XXA Exhaustion due to excessive exertion, initial encounter: Secondary | ICD-10-CM | POA: Insufficient documentation

## 2014-02-23 DIAGNOSIS — IMO0002 Reserved for concepts with insufficient information to code with codable children: Secondary | ICD-10-CM | POA: Insufficient documentation

## 2014-02-23 DIAGNOSIS — G40909 Epilepsy, unspecified, not intractable, without status epilepticus: Secondary | ICD-10-CM | POA: Insufficient documentation

## 2014-02-23 DIAGNOSIS — F17200 Nicotine dependence, unspecified, uncomplicated: Secondary | ICD-10-CM | POA: Insufficient documentation

## 2014-02-23 MED ORDER — OXYCODONE-ACETAMINOPHEN 5-325 MG PO TABS
2.0000 | ORAL_TABLET | Freq: Once | ORAL | Status: AC
  Administered 2014-02-23: 2 via ORAL
  Filled 2014-02-23: qty 2

## 2014-02-23 MED ORDER — OXYCODONE-ACETAMINOPHEN 5-325 MG PO TABS: 1.0000 | ORAL_TABLET | ORAL | Status: DC | PRN

## 2014-02-23 MED ORDER — ONDANSETRON 4 MG PO TBDP
4.0000 mg | ORAL_TABLET | Freq: Once | ORAL | Status: AC
  Administered 2014-02-23: 4 mg via ORAL
  Filled 2014-02-23: qty 1

## 2014-02-23 MED ORDER — CYCLOBENZAPRINE HCL 10 MG PO TABS: 10.0000 mg | ORAL_TABLET | Freq: Two times a day (BID) | ORAL | Status: DC | PRN

## 2014-02-23 NOTE — ED Provider Notes (Signed)
CSN: [id number]     Arrival date & time 02/23/14  1403 History  This chart was scribed for non-physician practitioner, Arthor CaptainAbigail Nansi Birmingham, PA-C, working with Rolland PorterMark James, MD by Shari HeritageAisha Amuda, ED Scribe. This patient was seen in room TR11C/TR11C and the patient's care was started at 3:03 PM.  Chief Complaint  Patient presents with  . Back Pain    The history is provided by the patient. No language interpreter was used.    HPI Comments: [name] is a 43 y.o. Male with a history of chronic back pain secondary to degenerative disc disease who presents to the Emergency Department complaining of worsening, constant, moderate to severe, lower back pain that began yesterday after he ran a jackhammer at work. Pain shoots anteriorly down his left leg. Pain is exacerbated by ambulation and movement. Pain is relieved mildly while patient is lying down. He states that he has some associated intermittent numbness in his legs. He denies any extremity weakness. He denies fever, rash, headache, photophobia, bladder incontinence, bowel incontinence. Patient's other medical history includes COPD, bipolar disorder and depression.  Past Medical History  Diagnosis Date  . COPD (chronic obstructive pulmonary disease)   . Arthritis   . Seizures     only once 12/30/2012  . Depression   . Bipolar disorder   . ADD (attention deficit disorder)    Past Surgical History  Procedure Laterality Date  . Mandible fracture surgery    . Appendectomy     No family history on file. History  Substance Use Topics  . Smoking status: Current Every Day Smoker -- 1.00 packs/day    Types: Cigarettes  . Smokeless tobacco: Never Used  . Alcohol Use: No    Review of Systems  Constitutional: Negative for fever and chills.  Eyes: Negative for photophobia.  Gastrointestinal:       Negative for bowel incontinence.   Genitourinary:       Negative for bladder incontinence.  Musculoskeletal: Positive for back pain.  Skin: Negative  for rash.  Neurological: Positive for numbness. Negative for weakness and headaches.    Allergies  Abilify and Benadryl  Home Medications   Prior to Admission medications   Medication Sig Start Date End Date Taking? Authorizing Provider  gabapentin (NEURONTIN) 100 MG capsule Take 2 capsules (200 mg total) by mouth 3 (three) times daily. For anxiety/pain control 02/19/14   Sanjuana KavaAgnes I Nwoko, NP  lamoTRIgine (LAMICTAL) 25 MG tablet Take 1 tablet (25 mg total) by mouth 2 (two) times daily. For mood stabilization 02/19/14   Sanjuana KavaAgnes I Nwoko, NP  methylphenidate (RITALIN) 5 MG tablet Take 3 tablets (15 mg total) by mouth 2 (two) times daily with breakfast and lunch. For ADHD 02/19/14   Sanjuana KavaAgnes I Nwoko, NP  prazosin (MINIPRESS) 1 MG capsule Take 1 capsule (1 mg total) by mouth at bedtime. For night-mares 02/19/14   Sanjuana KavaAgnes I Nwoko, NP  traZODone (DESYREL) 100 MG tablet Take 1 tablet (100 mg total) by mouth at bedtime as needed and may repeat dose one time if needed for sleep. 02/19/14   Sanjuana KavaAgnes I Nwoko, NP   Triage Vitals: BP 131/71  Pulse 79  Temp(Src) 98.1 F (36.7 C) (Oral)  Resp 16  SpO2 100% Physical Exam  Constitutional: He is oriented to person, place, and time. He appears well-developed and well-nourished.  HENT:  Head: Normocephalic and atraumatic.  Eyes: Conjunctivae and EOM are normal.  Neck: Neck supple.  Cardiovascular: Normal rate.   Pulmonary/Chest: Effort normal.  Musculoskeletal:  Normal range of motion.  Neurological: He is alert and oriented to person, place, and time.  Normal patellar reflexes. Good strength of bilateral lower extremities.  Skin: Skin is warm and dry.  Psychiatric: He has a normal mood and affect. His behavior is normal.    ED Course  Procedures (including critical care time) DIAGNOSTIC STUDIES: Oxygen Saturation is 100% on room air, normal by my interpretation.    COORDINATION OF CARE: 3:11 PM- Patient informed of current plan for treatment and evaluation and  agrees with plan at this time.    MDM   Final diagnoses:  Back pain    Patient with back pain.  No neurological deficits and normal neuro exam.  Patient can walk but states is painful.  No loss of bowel or bladder control.  No concern for cauda equina.  No fever, night sweats, weight loss, h/o cancer, IVDU.  RICE protocol and pain medicine indicated and discussed with patient.  I personally performed the services described in this documentation, which was scribed in my presence. The recorded information has been reviewed and considered.  Arthor CaptainAbigail Charmelle Soh, PA-C 02/24/14 [phone]

## 2014-02-23 NOTE — Discharge Instructions (Signed)
SEEK IMMEDIATE MEDICAL ATTENTION IF: °New numbness, tingling, weakness, or problem with the use of your arms or legs.  °Severe back pain not relieved with medications.  °Change in bowel or bladder control.  °Increasing pain in any areas of the body (such as chest or abdominal pain).  °Shortness of breath, dizziness or fainting.  °Nausea (feeling sick to your stomach), vomiting, fever, or sweats. ° ° ° °Back Pain, Adult °Low back pain is very common. About 1 in 5 people have back pain. The cause of low back pain is rarely dangerous. The pain often gets better over time. About half of people with a sudden onset of back pain feel better in just 2 weeks. About 8 in 10 people feel better by 6 weeks.  °CAUSES °Some common causes of back pain include: °· Strain of the muscles or ligaments supporting the spine. °· Wear and tear (degeneration) of the spinal discs. °· Arthritis. °· Direct injury to the back. °DIAGNOSIS °Most of the time, the direct cause of low back pain is not known. However, back pain can be treated effectively even when the exact cause of the pain is unknown. Answering your caregiver's questions about your overall health and symptoms is one of the most accurate ways to make sure the cause of your pain is not dangerous. If your caregiver needs more information, he or she may order lab work or imaging tests (X-rays or MRIs). However, even if imaging tests show changes in your back, this usually does not require surgery. °HOME CARE INSTRUCTIONS °For many people, back pain returns. Since low back pain is rarely dangerous, it is often a condition that people can learn to manage on their own.  °· Remain active. It is stressful on the back to sit or stand in one place. Do not sit, drive, or stand in one place for more than 30 minutes at a time. Take short walks on level surfaces as soon as pain allows. Try to increase the length of time you walk each day. °· Do not stay in bed. Resting more than 1 or 2 days can  delay your recovery. °· Do not avoid exercise or work. Your body is made to move. It is not dangerous to be active, even though your back may hurt. Your back will likely heal faster if you return to being active before your pain is gone. °· Pay attention to your body when you  bend and lift. Many people have less discomfort when lifting if they bend their knees, keep the load close to their bodies, and avoid twisting. Often, the most comfortable positions are those that put less stress on your recovering back. °· Find a comfortable position to sleep. Use a firm mattress and lie on your side with your knees slightly bent. If you lie on your back, put a pillow under your knees. °· Only take over-the-counter or prescription medicines as directed by your caregiver. Over-the-counter medicines to reduce pain and inflammation are often the most helpful. Your caregiver may prescribe muscle relaxant drugs. These medicines help dull your pain so you can more quickly return to your normal activities and healthy exercise. °· Put ice on the injured area. °· Put ice in a plastic bag. °· Place a towel between your skin and the bag. °· Leave the ice on for 15-20 minutes, 03-04 times a day for the first 2 to 3 days. After that, ice and heat may be alternated to reduce pain and spasms. °· Ask your caregiver about trying back exercises and gentle massage. This may be of some benefit. °· Avoid feeling anxious   or stressed. Stress increases muscle tension and can worsen back pain. It is important to recognize when you are anxious or stressed and learn ways to manage it. Exercise is a great option. °SEEK MEDICAL CARE IF: °· You have pain that is not relieved with rest or medicine. °· You have pain that does not improve in 1 week. °· You have new symptoms. °· You are generally not feeling well. °SEEK IMMEDIATE MEDICAL CARE IF:  °· You have pain that radiates from your back into your legs. °· You develop new bowel or bladder control  problems. °· You have unusual weakness or numbness in your arms or legs. °· You develop nausea or vomiting. °· You develop abdominal pain. °· You feel faint. °Document Released: 10/18/2005 Document Revised: 04/18/2012 Document Reviewed: 03/08/2011 °ExitCare® Patient Information ©2014 [employer]. ° °

## 2014-02-23 NOTE — ED Notes (Signed)
Pt. 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

## 2014-03-03 ENCOUNTER — Encounter (HOSPITAL_COMMUNITY): Payer: Self-pay | Admitting: Emergency Medicine

## 2014-03-03 ENCOUNTER — Emergency Department (HOSPITAL_COMMUNITY)
Admission: EM | Admit: 2014-03-03 | Discharge: 2014-03-03 | Disposition: A | Discharge status: Home / Self Care | Payer: Medicaid Other | Attending: Emergency Medicine | Admitting: Emergency Medicine

## 2014-03-03 ENCOUNTER — Emergency Department (HOSPITAL_COMMUNITY): Payer: Medicaid Other

## 2014-03-03 DIAGNOSIS — M5416 Radiculopathy, lumbar region: Secondary | ICD-10-CM

## 2014-03-03 DIAGNOSIS — J449 Chronic obstructive pulmonary disease, unspecified: Secondary | ICD-10-CM | POA: Insufficient documentation

## 2014-03-03 DIAGNOSIS — M129 Arthropathy, unspecified: Secondary | ICD-10-CM | POA: Insufficient documentation

## 2014-03-03 DIAGNOSIS — G40909 Epilepsy, unspecified, not intractable, without status epilepticus: Secondary | ICD-10-CM | POA: Insufficient documentation

## 2014-03-03 DIAGNOSIS — IMO0002 Reserved for concepts with insufficient information to code with codable children: Secondary | ICD-10-CM | POA: Insufficient documentation

## 2014-03-03 DIAGNOSIS — J4489 Other specified chronic obstructive pulmonary disease: Secondary | ICD-10-CM | POA: Insufficient documentation

## 2014-03-03 DIAGNOSIS — Y9301 Activity, walking, marching and hiking: Secondary | ICD-10-CM | POA: Insufficient documentation

## 2014-03-03 DIAGNOSIS — F319 Bipolar disorder, unspecified: Secondary | ICD-10-CM | POA: Insufficient documentation

## 2014-03-03 DIAGNOSIS — Z79899 Other long term (current) drug therapy: Secondary | ICD-10-CM | POA: Insufficient documentation

## 2014-03-03 DIAGNOSIS — R296 Repeated falls: Secondary | ICD-10-CM | POA: Insufficient documentation

## 2014-03-03 DIAGNOSIS — Y9289 Other specified places as the place of occurrence of the external cause: Secondary | ICD-10-CM | POA: Insufficient documentation

## 2014-03-03 DIAGNOSIS — Y990 Civilian activity done for income or pay: Secondary | ICD-10-CM | POA: Insufficient documentation

## 2014-03-03 DIAGNOSIS — F988 Other specified behavioral and emotional disorders with onset usually occurring in childhood and adolescence: Secondary | ICD-10-CM | POA: Insufficient documentation

## 2014-03-03 DIAGNOSIS — F17200 Nicotine dependence, unspecified, uncomplicated: Secondary | ICD-10-CM | POA: Insufficient documentation

## 2014-03-03 MED ORDER — OXYCODONE-ACETAMINOPHEN 5-325 MG PO TABS: 2.0000 | ORAL_TABLET | ORAL | Status: DC | PRN

## 2014-03-03 MED ORDER — HYDROMORPHONE HCL PF 1 MG/ML IJ SOLN
1.0000 mg | Freq: Once | INTRAMUSCULAR | Status: AC
  Administered 2014-03-03: 1 mg via INTRAMUSCULAR
  Filled 2014-03-03: qty 1

## 2014-03-03 MED ORDER — PREDNISONE 20 MG PO TABS: 40.0000 mg | ORAL_TABLET | Freq: Every day | ORAL | Status: DC

## 2014-03-03 NOTE — ED Notes (Signed)
He states that a week ago Friday at his workplace (9 days ago) he "ran a jack-hammer for about 7 hours"; the day after which his "back was real stiff and sore".  He states [facility] and was surprised that he did not have x-ray at that time.  He also tells me that 2 days ago at his workplace he was walking up on a counter-top he stepped through a sink opening, causing him to twist, and to avert further problems he jumped to the ground.  These events mad his back pain worse.  He states he has known djd.  He c/o low back pain radiating into bilat. Ant. Thighs with "my left thigh gets numb sometimes".  CMS intact all toes bilat.

## 2014-03-03 NOTE — ED Provider Notes (Signed)
CSN: [id number]     Arrival date & time 03/03/14  1359 History   First MD Initiated Contact with Patient 03/03/14 1616     Chief Complaint  Patient presents with  . Back Pain     (Consider location/radiation/quality/duration/timing/severity/associated sxs/prior Treatment) HPI [name] is a 43 y.o. male who presents to emergency department complaining of back pain. Patient states that he was using a jackhammer a week and a half ago, states the next day woke up with severe lower back pain. He was seen in emergency Department emesis, and was prescribed Percocet, Flexeril, Naprosyn. Patient states that he took medications and it did help his pain. He states back pain persisted. He states he had another fall 2 days ago, states was walking on top of the cabinets in his job and he felt the right hole in the counter landing on his feet however states he then rolled and fell onto his back. He states this made his pain worse. He reports currently he is taking Neurontin, Flexeril, ibuprofen for his pain with no relief. He denies any numbness or weakness in extremities. He states he does have pain radiating to bilateral front thighs. He denies any fever chills. Denies any IV drug use. He denies any loss of bowels or urine. Pain worsened with movement and walking. Not relieved by anything.   Past Medical History  Diagnosis Date  . COPD (chronic obstructive pulmonary disease)   . Arthritis   . Seizures     only once 12/30/2012  . Depression   . Bipolar disorder   . ADD (attention deficit disorder)    Past Surgical History  Procedure Laterality Date  . Mandible fracture surgery    . Appendectomy     No family history on file. History  Substance Use Topics  . Smoking status: Current Every Day Smoker -- 1.00 packs/day    Types: Cigarettes  . Smokeless tobacco: Never Used  . Alcohol Use: No    Review of Systems  Constitutional: Negative for fever and chills.  Respiratory: Negative for cough,  chest tightness and shortness of breath.   Cardiovascular: Negative for chest pain, palpitations and leg swelling.  Gastrointestinal: Negative for nausea, vomiting, abdominal pain, diarrhea and abdominal distention.  Genitourinary: Negative for dysuria, urgency, frequency and hematuria.  Musculoskeletal: Positive for back pain. Negative for neck pain and neck stiffness.  Skin: Negative for rash.  Allergic/Immunologic: Negative for immunocompromised state.  Neurological: Negative for dizziness, weakness, light-headedness, numbness and headaches.      Allergies  Abilify; Benadryl; Ketorolac; and Morphine and related  Home Medications   Prior to Admission medications   Medication Sig Start Date End Date Taking? Authorizing Provider  cyclobenzaprine (FLEXERIL) 10 MG tablet Take 1 tablet (10 mg total) by mouth 2 (two) times daily as needed for muscle spasms. 02/23/14  Yes Arthor CaptainAbigail Harris, PA-C  gabapentin (NEURONTIN) 100 MG capsule Take 2 capsules (200 mg total) by mouth 3 (three) times daily. For anxiety/pain control 02/19/14  Yes Sanjuana KavaAgnes I Nwoko, NP  ibuprofen (ADVIL,MOTRIN) 200 MG tablet Take 800 mg by mouth every 6 (six) hours as needed (pain).   Yes Historical Provider, MD  lamoTRIgine (LAMICTAL) 25 MG tablet Take 1 tablet (25 mg total) by mouth 2 (two) times daily. For mood stabilization 02/19/14  Yes Sanjuana KavaAgnes I Nwoko, NP  methylphenidate (RITALIN) 5 MG tablet Take 3 tablets (15 mg total) by mouth 2 (two) times daily with breakfast and lunch. For ADHD 02/19/14  Yes Sanjuana KavaAgnes I Nwoko,  NP  Omega-3 Fatty Acids (FISH OIL PO) Take 1 capsule by mouth daily.   Yes Historical Provider, MD  oxyCODONE-acetaminophen (PERCOCET) 5-325 MG per tablet Take 1-2 tablets by mouth every 4 (four) hours as needed. 02/23/14  Yes Arthor CaptainAbigail Harris, PA-C  prazosin (MINIPRESS) 1 MG capsule Take 1 capsule (1 mg total) by mouth at bedtime. For night-mares 02/19/14  Yes Sanjuana KavaAgnes I Nwoko, NP  traZODone (DESYREL) 100 MG tablet Take 1 tablet  (100 mg total) by mouth at bedtime as needed and may repeat dose one time if needed for sleep. 02/19/14  Yes Sanjuana KavaAgnes I Nwoko, NP   BP 131/73  Pulse 81  Temp(Src) 97.7 F (36.5 C) (Oral)  SpO2 99% Physical Exam  Nursing note and vitals reviewed. Constitutional: He is oriented to person, place, and time. He appears well-developed and well-nourished. No distress.  HENT:  Head: Normocephalic.  Eyes: Conjunctivae are normal.  Neck: Normal range of motion. Neck supple.  Cardiovascular: Normal rate, regular rhythm and normal heart sounds.   Pulmonary/Chest: Effort normal and breath sounds normal. No respiratory distress. He has no wheezes. He has no rales.  Abdominal: Soft. There is no tenderness.  Musculoskeletal:  Midline lumbar spine tenderness. Left SI joint tenderness. Pain with bilateral straight leg raise. Dorsal pedal pulses intact bilaterally. No swelling in lower extremities.  Neurological: He is alert and oriented to person, place, and time.  5/5 and equal lower extremity strength. 2+ and equal patellar reflexes bilaterally. Pt able to dorsiflex bilateral toes and feet with good strength against resistance. Equal sensation bilaterally over thighs and lower legs.   Skin: Skin is warm and dry.    ED Course  Procedures (including critical care time) Labs Review Labs Reviewed - No data to display  Imaging Review Dg Lumbar Spine Complete  03/03/2014   CLINICAL DATA:  Injury 1 week ago.  Low back pain.  EXAM: LUMBAR SPINE - COMPLETE 4+ VIEW  COMPARISON:  None.  FINDINGS: No fracture. No spondylolisthesis. There is minor loss of disc height at L4-L5. Remaining disc spaces are well preserved as are the facet joints.  Soft tissues are unremarkable.  IMPRESSION: Minor loss of disc height at L4-L5.  No other abnormality.   Electronically Signed   By: Amie Portlandavid  Ormond M.D.   On: 03/03/2014 17:21     EKG Interpretation None      MDM   Final diagnoses:  Lumbar radicular pain   Patient  with lower back pain after injuring it a week ago and again 2 days ago. xrays obtained today and is normal other than minor loss of disc height at L4-L5. Pt is ambulatory. Neurovascularly intact. No concern for cauda equina. Denise IV drug use. No fever. Plan to d/c home with steroid pack, pain medications. Pt states he has an apt with Dr. Welton FlakesKhan, the "back specialist" in 4 days. Return precautions discussed.   Filed Vitals:   03/03/14 1515  BP: 131/73  Pulse: 81  Temp: 97.7 F (36.5 C)  TempSrc: Oral  SpO2: 99%     Mahogani Holohan A Nelva Hauk, PA-C 03/04/14 0120

## 2014-03-03 NOTE — ED Notes (Addendum)
Pt states he injured his back last Friday while operating a jack hammer.  Pt c/o continuing pain in his low back.  Now c/o numbness to lt leg.  Denies defecating or urinating on himself.

## 2014-03-03 NOTE — Discharge Instructions (Signed)
Take prednisone as prescribed until all gone. Percocet for severe pain. Follow up with your back pain specialist as scheduled.    Lumbosacral Radiculopathy Lumbosacral radiculopathy is a pinched nerve or nerves in the low back (lumbosacral area). When this happens you may have weakness in your legs and may not be able to stand on your toes. You may have pain going down into your legs. There may be difficulties with walking normally. There are many causes of this problem. Sometimes this may happen from an injury, or simply from arthritis or boney problems. It may also be caused by other illnesses such as diabetes. If there is no improvement after treatment, further studies may be done to find the exact cause. DIAGNOSIS  X-rays may be needed if the problems become long standing. Electromyograms may be done. This study is one in which the working of nerves and muscles is studied. HOME CARE INSTRUCTIONS   Applications of ice packs may be helpful. Ice can be used in a plastic bag with a towel around it to prevent frostbite to skin. This may be used every 2 hours for 20 to 30 minutes, or as needed, while awake, or as directed by your caregiver.  Only take over-the-counter or prescription medicines for pain, discomfort, or fever as directed by your caregiver.  If physical therapy was prescribed, follow your caregiver's directions. SEEK IMMEDIATE MEDICAL CARE IF:   You have pain not controlled with medications.  You seem to be getting worse rather than better.  You develop increasing weakness in your legs.  You develop loss of bowel or bladder control.  You have difficulty with walking or balance, or develop clumsiness in the use of your legs.  You have a fever. MAKE SURE YOU:   Understand these instructions.  Will watch your condition.  Will get help right away if you are not doing well or get worse. Document Released: 10/18/2005 Document Revised: 01/10/2012 Document Reviewed:  06/07/2008 [facility]ExitCare Patient Information 2014 [employer] [place].

## 2014-03-06 NOTE — ED Provider Notes (Signed)
Medical screening examination/treatment/procedure(s) were performed by non-physician practitioner and as supervising physician I was immediately available for consultation/collaboration.    Essie Lagunes R Naveah Brave, MD 03/06/14 1517 

## 2014-03-09 ENCOUNTER — Emergency Department: Payer: Self-pay | Admitting: Emergency Medicine

## 2014-03-09 NOTE — Progress Notes (Signed)
Agree with above assessment and plan 

## 2014-03-10 ENCOUNTER — Emergency Department: Payer: Self-pay | Admitting: Emergency Medicine

## 2014-03-12 NOTE — ED Provider Notes (Signed)
Medical screening examination/treatment/procedure(s) were performed by non-physician practitioner and as supervising physician I was immediately available for consultation/collaboration.   EKG Interpretation None        Estreya Clay, MD 03/12/14 1907 

## 2014-04-06 ENCOUNTER — Encounter (HOSPITAL_COMMUNITY): Payer: Self-pay | Admitting: Emergency Medicine

## 2014-04-06 ENCOUNTER — Emergency Department (HOSPITAL_COMMUNITY)
Admission: EM | Admit: 2014-04-06 | Discharge: 2014-04-06 | Disposition: A | Discharge status: Home / Self Care | Payer: Medicaid Other | Attending: Emergency Medicine | Admitting: Emergency Medicine

## 2014-04-06 ENCOUNTER — Emergency Department (HOSPITAL_COMMUNITY): Payer: Medicaid Other

## 2014-04-06 DIAGNOSIS — R05 Cough: Secondary | ICD-10-CM | POA: Insufficient documentation

## 2014-04-06 DIAGNOSIS — K2990 Gastroduodenitis, unspecified, without bleeding: Principal | ICD-10-CM

## 2014-04-06 DIAGNOSIS — Z79899 Other long term (current) drug therapy: Secondary | ICD-10-CM | POA: Insufficient documentation

## 2014-04-06 DIAGNOSIS — R109 Unspecified abdominal pain: Secondary | ICD-10-CM

## 2014-04-06 DIAGNOSIS — Z8739 Personal history of other diseases of the musculoskeletal system and connective tissue: Secondary | ICD-10-CM | POA: Insufficient documentation

## 2014-04-06 DIAGNOSIS — G40909 Epilepsy, unspecified, not intractable, without status epilepticus: Secondary | ICD-10-CM | POA: Insufficient documentation

## 2014-04-06 DIAGNOSIS — R059 Cough, unspecified: Secondary | ICD-10-CM | POA: Insufficient documentation

## 2014-04-06 DIAGNOSIS — J449 Chronic obstructive pulmonary disease, unspecified: Secondary | ICD-10-CM | POA: Insufficient documentation

## 2014-04-06 DIAGNOSIS — J4489 Other specified chronic obstructive pulmonary disease: Secondary | ICD-10-CM | POA: Insufficient documentation

## 2014-04-06 DIAGNOSIS — F319 Bipolar disorder, unspecified: Secondary | ICD-10-CM | POA: Insufficient documentation

## 2014-04-06 DIAGNOSIS — F17200 Nicotine dependence, unspecified, uncomplicated: Secondary | ICD-10-CM | POA: Insufficient documentation

## 2014-04-06 DIAGNOSIS — K2970 Gastritis, unspecified, without bleeding: Secondary | ICD-10-CM

## 2014-04-06 DIAGNOSIS — M549 Dorsalgia, unspecified: Secondary | ICD-10-CM | POA: Insufficient documentation

## 2014-04-06 DIAGNOSIS — Z9089 Acquired absence of other organs: Secondary | ICD-10-CM | POA: Insufficient documentation

## 2014-04-06 LAB — COMPREHENSIVE METABOLIC PANEL
ALBUMIN: 3.9 g/dL (ref 3.5–5.2)
ALT: 21 U/L (ref 0–53)
AST: 23 U/L (ref 0–37)
Alkaline Phosphatase: 55 U/L (ref 39–117)
BUN: 13 mg/dL (ref 6–23)
CALCIUM: 9.6 mg/dL (ref 8.4–10.5)
CHLORIDE: 103 meq/L (ref 96–112)
CO2: 24 mEq/L (ref 19–32)
Creatinine, Ser: 1.06 mg/dL (ref 0.50–1.35)
GFR calc Af Amer: >90 mL/min (ref >90)
GFR calc non Af Amer: 86 mL/min — ABNORMAL LOW (ref >90)
Glucose, Bld: 118 mg/dL — ABNORMAL HIGH (ref 70–99)
Potassium: 4.2 mEq/L (ref 3.7–5.3)
Sodium: 139 mEq/L (ref 137–147)
Total Bilirubin: 0.3 mg/dL (ref 0.3–1.2)
Total Protein: 7.9 g/dL (ref 6.0–8.3)

## 2014-04-06 LAB — CBC WITH DIFFERENTIAL/PLATELET
BASOS ABS: 0 10*3/uL (ref 0.0–0.1)
BASOS PCT: 0 % (ref 0–1)
EOS PCT: 2 % (ref 0–5)
Eosinophils Absolute: 0.1 10*3/uL (ref 0.0–0.7)
HEMATOCRIT: 40.5 % (ref 39.0–52.0)
Hemoglobin: 13.9 g/dL (ref 13.0–17.0)
Lymphocytes Relative: 25 % (ref 12–46)
Lymphs Abs: 1.7 10*3/uL (ref 0.7–4.0)
MCH: 31.7 pg (ref 26.0–34.0)
MCHC: 34.3 g/dL (ref 30.0–36.0)
MCV: 92.5 fL (ref 78.0–100.0)
MONO ABS: 0.6 10*3/uL (ref 0.1–1.0)
Monocytes Relative: 9 % (ref 3–12)
NEUTROS ABS: 4.3 10*3/uL (ref 1.7–7.7)
Neutrophils Relative %: 64 % (ref 43–77)
Platelets: 272 10*3/uL (ref 150–400)
RBC: 4.38 MIL/uL (ref 4.22–5.81)
RDW: 14.6 % (ref 11.5–15.5)
WBC: 6.7 10*3/uL (ref 4.0–10.5)

## 2014-04-06 LAB — URINALYSIS, ROUTINE W REFLEX MICROSCOPIC
Bilirubin Urine: NEGATIVE
GLUCOSE, UA: NEGATIVE mg/dL
Hgb urine dipstick: NEGATIVE
Ketones, ur: NEGATIVE mg/dL
LEUKOCYTES UA: NEGATIVE
Nitrite: NEGATIVE
PH: 7 (ref 5.0–8.0)
Protein, ur: NEGATIVE mg/dL
Specific Gravity, Urine: 1.023 (ref 1.005–1.030)
Urobilinogen, UA: 1 mg/dL (ref 0.0–1.0)

## 2014-04-06 LAB — POC OCCULT BLOOD, ED: FECAL OCCULT BLD: POSITIVE — AB

## 2014-04-06 LAB — LIPASE, BLOOD: Lipase: 25 U/L (ref 11–59)

## 2014-04-06 MED ORDER — PANTOPRAZOLE SODIUM 20 MG PO TBEC: 40.0000 mg | DELAYED_RELEASE_TABLET | Freq: Every day | ORAL | Status: DC

## 2014-04-06 MED ORDER — HYDROMORPHONE HCL PF 1 MG/ML IJ SOLN
1.0000 mg | Freq: Once | INTRAMUSCULAR | Status: AC
  Administered 2014-04-06: 1 mg via INTRAVENOUS
  Filled 2014-04-06: qty 1

## 2014-04-06 MED ORDER — SODIUM CHLORIDE 0.9 % IV BOLUS (SEPSIS)
1000.0000 mL | Freq: Once | INTRAVENOUS | Status: AC
  Administered 2014-04-06: 1000 mL via INTRAVENOUS

## 2014-04-06 MED ORDER — ONDANSETRON HCL 4 MG PO TABS: 4.0000 mg | ORAL_TABLET | Freq: Three times a day (TID) | ORAL | Status: DC | PRN

## 2014-04-06 MED ORDER — ONDANSETRON 4 MG PO TBDP
8.0000 mg | ORAL_TABLET | Freq: Once | ORAL | Status: AC
  Administered 2014-04-06: 8 mg via ORAL
  Filled 2014-04-06: qty 2

## 2014-04-06 MED ORDER — GI COCKTAIL ~~LOC~~
30.0000 mL | Freq: Once | ORAL | Status: AC
  Administered 2014-04-06: 30 mL via ORAL
  Filled 2014-04-06: qty 30

## 2014-04-06 NOTE — Discharge Instructions (Signed)
Abdominal Pain, Adult Many things can cause abdominal pain. Usually, abdominal pain is not caused by a disease and will improve without treatment. It can often be observed and treated at home. Your health care provider will do a physical exam and possibly order blood tests and X-rays to help determine the seriousness of your pain. However, in many cases, more time must pass before a clear cause of the pain can be found. Before that point, your health care provider may not know if you need more testing or further treatment. HOME CARE INSTRUCTIONS  Monitor your abdominal pain for any changes. The following actions may help to alleviate any discomfort you are experiencing:  Only take over-the-counter or prescription medicines as directed by your health care provider.  Do not take laxatives unless directed to do so by your health care provider.  Try a clear liquid diet (broth, tea, or water) as directed by your health care provider. Slowly move to a bland diet as tolerated. SEEK MEDICAL CARE IF:  You have unexplained abdominal pain.  You have abdominal pain associated with nausea or diarrhea.  You have pain when you urinate or have a bowel movement.  You experience abdominal pain that wakes you in the night.  You have abdominal pain that is worsened or improved by eating food.  You have abdominal pain that is worsened with eating fatty foods. SEEK IMMEDIATE MEDICAL CARE IF:   Your pain does not go away within 2 hours.  You have a fever.  You keep throwing up (vomiting).  Your pain is felt only in portions of the abdomen, such as the right side or the left lower portion of the abdomen.  You pass bloody or black tarry stools. MAKE SURE YOU:  Understand these instructions.   Will watch your condition.   Will get help right away if you are not doing well or get worse.  Document Released: 07/28/2005 Document Revised: 08/08/2013 Document Reviewed: 06/27/2013 [facility] Patient  Information 2014 [place], [place].  Gastritis, Adult Gastritis is soreness and puffiness (inflammation) of the lining of the stomach. If you do not get help, gastritis can cause bleeding and sores (ulcers) in the stomach. HOME CARE   Only take medicine as told by your doctor.  If you were given antibiotic medicines, take them as told. Finish the medicines even if you start to feel better.  Drink enough fluids to keep your pee (urine) clear or pale yellow.  Avoid foods and drinks that make your problems worse. Foods you may want to avoid include:  Caffeine or alcohol.  Chocolate.  Mint.  Garlic and onions.  Spicy foods.  Citrus fruits, including oranges, lemons, or limes.  Food containing tomatoes, including sauce, chili, salsa, and pizza.  Fried and fatty foods.  Eat small meals throughout the day instead of large meals. GET HELP RIGHT AWAY IF:   You have black or dark red poop (stools).  You throw up (vomit) blood. It may look like coffee grounds.  You cannot keep fluids down.  Your belly (abdominal) pain gets worse.  You have a fever.  You do not feel better after 1 week.  You have any other questions or concerns. MAKE SURE YOU:   Understand these instructions.  Will watch your condition.  Will get help right away if you are not doing well or get worse. Document Released: 04/05/2008 Document Revised: 01/10/2012 Document Reviewed: 12/01/2011 [facility] Patient Information 2014 [place], [place].    Emergency Department Resource Guide 1) Find a  Doctor and Pay Out of Pocket Although you won't have to find out who is covered by your insurance plan, it is a good idea to ask around and get recommendations. You will then need to call the office and see if the doctor you have chosen will accept you as a new patient and what types of options they offer for patients who are self-pay. Some doctors offer discounts or will set up payment plans for their patients who do not  have insurance, but you will need to ask so you aren't surprised when you get to your appointment.  2) Contact Your Local Health Department Not all health departments have doctors that can see patients for sick visits, but many do, so it is worth a call to see if yours does. If you don't know where your local health department is, you can check in your phone book. The CDC also has a tool to help you locate your state's health department, and many state websites also have listings of all of their local health departments.  3) Find a Walk-in Clinic If your illness is not likely to be very severe or complicated, you may want to try a walk in clinic. These are popping up all over the country in pharmacies, drugstores, and shopping centers. They're usually staffed by nurse practitioners or physician assistants that have been trained to treat common illnesses and complaints. They're usually fairly quick and inexpensive. However, if you have serious medical issues or chronic medical problems, these are probably not your best option.  No Primary Care Doctor: - Call Health Connect at  [phone] - they can help you locate a primary care doctor that  accepts your insurance, provides certain services, etc. - Physician Referral Service- [phone]  Chronic Pain Problems: Organization         Address  Phone   Notes  [facility]  [phone] Patients need to be referred by their primary care doctor.   Medication Assistance: Organization         Address  Phone   Notes  [facility] Medication [facility] [address] [place] [place], [place] [address] [phone] --Must be a resident of [facility] -- Must have NO insurance coverage whatsoever (no Medicaid/ Medicare, etc.) -- The pt. MUST have a primary care doctor that directs their care regularly and follows them in the community   [facility]ist  [phone]   [employer]  [phone]    Agencies that  provide inexpensive medical care: Organization         Address  Phone   Notes  [facility]  [phone]   [facility]    [phone]   [facility] [address] [place], [place] [address] [phone]   [facility] [address] [place] [place] [phone]   Planned Parenthood    [phone]   [facility]    [phone]   [facility]  [address],  Phone:  [phone], Fax:  [phone] Hours of Operation:  9 am - 6 pm, M-F.  Also accepts Medicaid/Medicare and self-pay.  [facility]  [address],  Phone: [phone], Fax: [phone]. Hours of Operation:  8:30 am - 5:30 pm, M-F.  Also accepts Medicaid and self-pay.  [facility] [place] [address], [employer] Phone: [phone])  [phone]   [facility] [address], [place], [place] [phone], Ext. [phone] Mondays & Thursdays: 7-9 AM.  First 15 patients are seen on a first come, first serve basis.    Medicaid-accepting [facility] Providers:  Organization         Address  Phone   Notes  [facility] [address], [place] [phone] Also accepts self-pay patients.  [facility] [address], [place]  [phone](336) [phone]   [facility] [address], [place] [phone]) [phone]   [facility] [address] [place] [address], [place] [phone]) [phone]   Renaye RakersVeita Bland [address], [place]   [phone]) [phone] Only accepts [place] Access [place] patients after they have their name applied to their card.   Self-Pay (no insurance) in [facility]:  Organization         Address  Phone   Notes  Sickle Cell Patients, [facility] [address], [place] [phone](336) [phone]   [facility] [address], [place] [phone]) [phone]   [facility]  [address], [place] [phone]   [facility]/Dr. Osei-Bonsu  [address] [place] [address], [place] or [phone], [place] [phone]) [phone] Phone number for both [place] and [place] locations is the same.  [facility] [address], [place] [phone](336) [phone]   [facility] [place] [address] [place] [address], [place] or [address] [phone]) [phone]   [facility] [address], [place] [phone], phone; [phone]) [phone], fax Sees patients 1st and 3rd Saturday of every month.  Must not qualify for public or private insurance (i.e. Medicaid, Medicare, [place] Health Choice, Veterans' Benefits)  Household income should be no more than 200% of the poverty level The clinic cannot treat you if you are pregnant or think you are pregnant  Sexually transmitted diseases are not treated at the clinic.    Dental Care: Organization         Address  Phone  Notes  [facility] [address], [place] [phone]) [phone] Accepts children up to age 41 who are enrolled in [place] or [place] Health Choice; pregnant women with a Medicaid card; and children who have applied for Medicaid or [place] Health Choice, but were declined, whose parents can pay a reduced fee at time of service.  [facility] of [facility] [place]  [address], [place] [phone] Accepts children up to age 41 who are enrolled in [place] or [place] Health Choice; pregnant women with a Medicaid card; and children who have applied for Medicaid or  Health Choice, but were declined, whose parents can pay a reduced fee at time of service.  [facility]  [address], [place] [phone]) [phone] Patients are seen by appointment only. Walk-ins  are not accepted. [facility] will see patients 41 years of age and older. Monday - Tuesday (8am-5pm) Most Wednesdays (8:30-5pm) $30 per visit, cash only  [facility]  [address], [facility] [phone]) [phone] Patients are seen by appointment only. Walk-ins are not accepted. [facility] will see patients 41 years of age and older. One Wednesday Evening (Monthly: Volunteer Based).  $  30 per visit, cash only  [employer] of Dentistry Clinics  [phone] for adults; Children under age 86, call Graduate Pediatric Dentistry at [phone]. Children aged 32-14, please call [phone] to request a pediatric application.  Dental services are provided in all areas of dental care including fillings, crowns and bridges, complete and partial dentures, implants, gum treatment, root canals, and extractions. Preventive care is also provided. Treatment is provided to both adults and children. Patients are selected via a lottery and there is often a waiting list.   [facility] [address] [place]  [phone] [email or web address]   [facility] [address] [place], [place] [phone], Ext. [phone] Second and Fourth Thursday of each month, opens at 6:30 AM; Clinic ends at 9 AM.  Patients are seen on a first-come first-served basis, and a limited number are seen during each clinic.   [facility]  [address] [place], [place] [phone]   Eligibility Requirements You must have lived in [place], [place], or [place] counties for at least the last three months.   You cannot be eligible for state or federal sponsored [place], including [employer], [place], or [employer].   You generally cannot be eligible for healthcare insurance through your employer.    How to apply: Eligibility screenings are held every Tuesday and Wednesday afternoon from 1:00 pm until 4:00 pm. You do not need an appointment  for the interview!  [facility] [address], [place], [place] [phone]   [facility] Health Department  [phone]   [facility] Health Department  [phone]   [facility] Health Department  [phone]    Behavioral Health Resources in the Community: Intensive Outpatient Programs Organization         Address  Phone  Notes  [facility] [address] [place], [place] [phone]   [facility] Outpatient [address] [place], [place] [phone]   [facility] [address] [place], [place]  [phone]   [facility] [address]  [place], [place] [phone] or [phone]   Substance Abuse Resources Organization         Address  Phone  Notes  [facility] Drug [facility]  [phone]   [facility]  [phone]   [facility]  [phone]   Floydene Flock  [phone]   Residential & Outpatient Substance Abuse Program  [phone]   Psychological Services Organization         Address  Phone  Notes  [facility]  [phone]   [facility] Services  [phone]   [facility] Mental Health [address] [place] [phone] or [phone]    Mobile Crisis Teams Organization         Address  Phone  Notes  [facility], Mobile Crisis Care Unit  [phone]   [facility]  [address] [place], [place] [phone]   Doristine Locks [address] [place] [phone]    Self-Help/Support Groups Organization         Address  Phone             Notes  [facility] [place] - variety of support groups  336- I7437963 Call for more information  Narcotics Anonymous (NA), [facility] [address], [employer] [place]  2 meetings at this location   Statistician  Address  Phone  Notes  [facility] Residential  Treatment [address],    [place]  [phone]   [facility]  [address], [place] 161096, [place], [place]   Salcha Rogers, [place] [phone] Admissions: 8am-3pm M-F  Incentives Substance [place] [address],    [place], [place] [phone]   [facility] [address] [place], [place], [place]   [facility] [address]  [place], [place]   [facility] - Intensive Outpatient [address] Kristeen [address], [place], [place]   [facility] ([place] [place].,  [place], [place] [phone] or [phone]   [facility] (RTS) [address] [place], [place] Accepts Medicaid  [facility] [place] [address]  [place] [phone] Substance Abuse/Addiction Treatment   [facility] Organization         Address  Phone  Notes  [facility]  [phone]   Domenic Schwab, PhD [address] Arlis Porta [place], [place]   [phone] or [phone]   [place], [place] [phone]   [facility] [address] [place], [place] [phone] Insurance/Medicaid/sponsorship through [facility] and Families [address]                                    [place], [place] [phone] [place] [address], [place] [phone]    Dr. Adele Schilder  [phone]   [facility]. 1) [address] [place] 2) [place] 3)  [place] [address], [place] [phone]  [phone]   [place] [phone] or [phone] (After Hours)

## 2014-04-06 NOTE — ED Notes (Addendum)
Patient left without receiving discharge instructions.  Was not in the room when I went to deliver discharge instructions

## 2014-04-06 NOTE — ED Notes (Signed)
Patient transported to X-ray 

## 2014-04-06 NOTE — ED Notes (Signed)
Pt sts about 2 hours ago started having severe abdominal pain going into his sides. sts some nausea.

## 2014-04-06 NOTE — ED Provider Notes (Signed)
CSN: [id number]     Arrival date & time 04/06/14  1226 History   None    Chief Complaint  Patient presents with  . Abdominal Pain     (Consider location/radiation/quality/duration/timing/severity/associated sxs/prior Treatment) Patient is a 43 y.o. male presenting with general illness. The history is provided by the patient.  Illness Severity:  Moderate Onset quality:  Gradual Timing:  Intermittent Progression:  Worsening Chronicity:  New Associated symptoms: abdominal pain, cough (mild dry), nausea and vomiting   Associated symptoms: no chest pain, no congestion, no diarrhea, no fever, no headaches, no rash, no rhinorrhea and no shortness of breath     43 yo male with abd pain and emesis.  abd pain intermittent x10 days. Uncertain what causes it. Sometimes about 30-60 minutes after eating, but sometimes unrelated with eating.  Has had nausea and vomiting. This does occur about 30 minutes after eating. Has burning sensation that radiates into throat. Denies h/o GERD.  abd pain is epigastric and radiates to both sides. No focal RUQ pain.  H/o appendectomy.  No urinary changes.  Some ongoing constipation. Has noticed some red blood on TP intermittently x1 month or longer.    Past Medical History  Diagnosis Date  . COPD (chronic obstructive pulmonary disease)   . Arthritis   . Seizures     only once 12/30/2012  . Depression   . Bipolar disorder   . ADD (attention deficit disorder)    Past Surgical History  Procedure Laterality Date  . Mandible fracture surgery    . Appendectomy     History reviewed. No pertinent family history. History  Substance Use Topics  . Smoking status: Current Every Day Smoker -- 1.00 packs/day    Types: Cigarettes  . Smokeless tobacco: Never Used  . Alcohol Use: No    Review of Systems  Constitutional: Negative for fever and chills.  HENT: Negative for congestion and rhinorrhea.   Respiratory: Positive for cough (mild dry). Negative for  shortness of breath.   Cardiovascular: Negative for chest pain and leg swelling.  Gastrointestinal: Positive for nausea, vomiting, abdominal pain and blood in stool. Negative for diarrhea.  Genitourinary: Negative for flank pain and difficulty urinating.  Musculoskeletal: Positive for back pain (mild bilateral low back.).  Skin: Negative for color change, rash and wound.  Neurological: Negative for dizziness, light-headedness and headaches.  All other systems reviewed and are negative.     Allergies  Abilify; Benadryl; Ketorolac; and Morphine and related  Home Medications   Prior to Admission medications   Medication Sig Start Date End Date Taking? Authorizing Provider  gabapentin (NEURONTIN) 100 MG capsule Take 2 capsules (200 mg total) by mouth 3 (three) times daily. For anxiety/pain control 02/19/14  Yes Sanjuana Kava, NP  ibuprofen (ADVIL,MOTRIN) 200 MG tablet Take 800 mg by mouth every 6 (six) hours as needed (pain).   Yes Historical Provider, MD  lamoTRIgine (LAMICTAL) 25 MG tablet Take 1 tablet (25 mg total) by mouth 2 (two) times daily. For mood stabilization 02/19/14  Yes Sanjuana Kava, NP  Omega-3 Fatty Acids (FISH OIL PO) Take 1 capsule by mouth daily.   Yes Historical Provider, MD  prazosin (MINIPRESS) 1 MG capsule Take 1 capsule (1 mg total) by mouth at bedtime. For night-mares 02/19/14  Yes Sanjuana Kava, NP  traZODone (DESYREL) 100 MG tablet Take 1 tablet (100 mg total) by mouth at bedtime as needed and may repeat dose one time if needed for sleep. 02/19/14  Yes  Sanjuana Kava, NP  ondansetron (ZOFRAN) 4 MG tablet Take 1 tablet (4 mg total) by mouth every 8 (eight) hours as needed for nausea or vomiting. 04/06/14   Stevie Kern, MD  pantoprazole (PROTONIX) 20 MG tablet Take 2 tablets (40 mg total) by mouth daily. 04/06/14   Stevie Kern, MD   BP 135/63  Pulse 86  Temp(Src) 98 F (36.7 C) (Oral)  Resp 18  SpO2 99% Physical Exam  Nursing note and vitals  reviewed. Constitutional: He is oriented to person, place, and time. He appears well-developed and well-nourished. No distress.  HENT:  Head: Normocephalic and atraumatic.  MMM  Eyes: Conjunctivae are normal. Right eye exhibits no discharge. Left eye exhibits no discharge.  Neck: No tracheal deviation present.  Cardiovascular: Normal rate, regular rhythm, normal heart sounds and intact distal pulses.   Pulmonary/Chest: Effort normal and breath sounds normal. No stridor. No respiratory distress. He has no wheezes. He has no rales.  Abdominal: Soft. He exhibits no distension (mild epigastric. ). There is tenderness. There is no rigidity, no guarding, no CVA tenderness and negative Murphy's sign.  Musculoskeletal: He exhibits no edema and no tenderness.  Neurological: He is alert and oriented to person, place, and time.  Skin: Skin is warm and dry.  Psychiatric: He has a normal mood and affect. His behavior is normal.    ED Course  Procedures (including critical care time) Labs Review Labs Reviewed  COMPREHENSIVE METABOLIC PANEL - Abnormal; Notable for the following:    Glucose, Bld 118 (*)    GFR calc non Af Amer 86 (*)    All other components within normal limits  POC OCCULT BLOOD, ED - Abnormal; Notable for the following:    Fecal Occult Bld POSITIVE (*)    All other components within normal limits  CBC WITH DIFFERENTIAL  LIPASE, BLOOD  URINALYSIS, ROUTINE W REFLEX MICROSCOPIC    Imaging Review US Abdomen Complete  04/06/2014   CLINICAL DATA:  Right upper quadrant abdominal pain  EXAM: ULTRASOUND ABDOMEN COMPLETE  COMPARISON:  CT scan of the abdomen and pelvis dated Mar 27, 2014  FINDINGS: Gallbladder:  No gallstones or wall thickening visualized. No sonographic Murphy sign noted.  Common bile duct:  Diameter: 4 mm  Liver:  No focal lesion identified. Within normal limits in parenchymal echogenicity.  IVC:  No abnormality visualized.  Pancreas:  Visualized portion unremarkable ; the  tail was obscured by bowel gas. Marland Kitchen  Spleen:  Size and appearance within normal limits.  Right Kidney:  Length: 11.3 cm. Echogenicity within normal limits. No mass or hydronephrosis visualized.  Left Kidney:  Length: 10.9 cm. Echogenicity within normal limits. No mass or hydronephrosis visualized.  Abdominal aorta:  No aneurysm visualized.  Other findings:  No ascites is demonstrated.  IMPRESSION: Normal abdominal ultrasound examination.   Electronically Signed   By: David  [place]   On: 04/06/2014 17:12   Dg Abd Acute W/chest  04/06/2014   CLINICAL DATA:  Abdominal pain.  Nausea and vomiting.  Cough.  EXAM: ACUTE ABDOMEN SERIES (ABDOMEN 2 VIEW & CHEST 1 VIEW)  COMPARISON:  Chest radiograph on 02/23/2011  FINDINGS: There is no evidence of dilated bowel loops or free intraperitoneal air. No radiopaque calculi or other significant radiographic abnormality is seen. Heart size and mediastinal contours are within normal limits. Both lungs are clear.  IMPRESSION: Negative abdominal radiographs.  No acute cardiopulmonary disease.   Electronically Signed   By: Myles Rosenthal M.D.   On: 04/06/2014  17:33     EKG Interpretation   Date/Time:  Saturday April 06 2014 14:30:47 EDT Ventricular Rate:  65 PR Interval:  114 QRS Duration: 102 QT Interval:  389 QTC Calculation: 404 R Axis:   82 Text Interpretation:  Sinus rhythm Borderline short PR interval rate  slower and t wave changes are no longer present since last tracing  Confirmed by KNAPP  MD-J, JON ([id number]) on 04/06/2014 3:34:07 PM      MDM   Final diagnoses:  Abdominal pain  Gastritis     Without clinical findings of dehydration.  Reassuring abd exam.  Labs and imaging without emergent finding. Do not believe ct scan warranted at this time. Repeat abd exam benign.  Likely gastritis. Start protonix.  Does have positive hemocult. Not tachycardic. Not anemic. H/o hemorrhoids. Only with small amount of blood on TP intermittently. Do not suspect  significant GI bleed.   Patient discharged home. Return precautions given. To follow up with pcp. patient in agreement with plan.   Discussed case with Dr. Lynelle DoctorKnapp who is in agreement with assessment and plan.     Stevie Kernyan Francis Yardley, MD 04/06/14 1900

## 2014-04-06 NOTE — ED Provider Notes (Signed)
I saw and evaluated the patient, reviewed the resident's note and I agree with the findings and plan.   EKG Interpretation   Date/Time:  Saturday April 06 2014 14:30:47 EDT Ventricular Rate:  65 PR Interval:  114 QRS Duration: 102 QT Interval:  389 QTC Calculation: 404 R Axis:   82 Text Interpretation:  Sinus rhythm Borderline short PR interval rate  slower and t wave changes are no longer present since last tracing  Confirmed by Sable Knoles  MD-J, Mika Griffitts ([id number]) on 04/06/2014 3:34:07 PM      Pt complains of epigastric abdominal pain with nausea. History of prior appendectomy. Labs normal.  Will check aas and abd [place].  Linwood Dibbles, MD 04/07/14 1630

## 2014-04-06 NOTE — ED Notes (Signed)
Abdominal/epigastric pain that radiates to bilaterally to mid abdomen x 5 days, n/v as well x 2 days.   Vomited 7 times last 24 hours, unsure of what it looks like, states he had some dark red stool x 1 month that has gradually worsened.   Ate breakfast this morning and had orange juice, threw up.  Vomited lunch up as well, [place] and pain became much worse around lunch time and now patient is here.  Last BM was this morning and it was normal other than color.  Denies trouble urinating.

## 2014-04-15 ENCOUNTER — Emergency Department (HOSPITAL_COMMUNITY)
Admission: EM | Admit: 2014-04-15 | Discharge: 2014-04-16 | Discharge status: Against Medical Advice | Payer: Medicaid Other | Attending: Emergency Medicine | Admitting: Emergency Medicine

## 2014-04-15 ENCOUNTER — Encounter (HOSPITAL_COMMUNITY): Payer: Self-pay | Admitting: Emergency Medicine

## 2014-04-15 DIAGNOSIS — F17200 Nicotine dependence, unspecified, uncomplicated: Secondary | ICD-10-CM | POA: Insufficient documentation

## 2014-04-15 DIAGNOSIS — Z8739 Personal history of other diseases of the musculoskeletal system and connective tissue: Secondary | ICD-10-CM | POA: Insufficient documentation

## 2014-04-15 DIAGNOSIS — J449 Chronic obstructive pulmonary disease, unspecified: Secondary | ICD-10-CM | POA: Insufficient documentation

## 2014-04-15 DIAGNOSIS — J4489 Other specified chronic obstructive pulmonary disease: Secondary | ICD-10-CM | POA: Insufficient documentation

## 2014-04-15 DIAGNOSIS — R45850 Homicidal ideations: Secondary | ICD-10-CM | POA: Insufficient documentation

## 2014-04-15 DIAGNOSIS — F329 Major depressive disorder, single episode, unspecified: Secondary | ICD-10-CM | POA: Insufficient documentation

## 2014-04-15 DIAGNOSIS — R45851 Suicidal ideations: Secondary | ICD-10-CM | POA: Insufficient documentation

## 2014-04-15 DIAGNOSIS — Z79899 Other long term (current) drug therapy: Secondary | ICD-10-CM | POA: Insufficient documentation

## 2014-04-15 DIAGNOSIS — G40909 Epilepsy, unspecified, not intractable, without status epilepticus: Secondary | ICD-10-CM | POA: Insufficient documentation

## 2014-04-15 LAB — COMPREHENSIVE METABOLIC PANEL
ALK PHOS: 56 U/L (ref 39–117)
ALT: 26 U/L (ref 0–53)
AST: 24 U/L (ref 0–37)
Albumin: 4.1 g/dL (ref 3.5–5.2)
BUN: 17 mg/dL (ref 6–23)
CALCIUM: 9.7 mg/dL (ref 8.4–10.5)
CO2: 27 meq/L (ref 19–32)
Chloride: 100 mEq/L (ref 96–112)
Creatinine, Ser: 1.12 mg/dL (ref 0.50–1.35)
GFR calc non Af Amer: 81 mL/min — ABNORMAL LOW (ref >90)
Glucose, Bld: 93 mg/dL (ref 70–99)
Potassium: 4.7 mEq/L (ref 3.7–5.3)
SODIUM: 138 meq/L (ref 137–147)
TOTAL PROTEIN: 7.7 g/dL (ref 6.0–8.3)
Total Bilirubin: 0.3 mg/dL (ref 0.3–1.2)

## 2014-04-15 LAB — RAPID URINE DRUG SCREEN, HOSP PERFORMED
AMPHETAMINES: NOT DETECTED
BARBITURATES: NOT DETECTED
BENZODIAZEPINES: NOT DETECTED
Cocaine: NOT DETECTED
Opiates: NOT DETECTED
TETRAHYDROCANNABINOL: NOT DETECTED

## 2014-04-15 LAB — CBC WITH DIFFERENTIAL/PLATELET
BASOS ABS: 0 10*3/uL (ref 0.0–0.1)
Basophils Relative: 0 % (ref 0–1)
EOS ABS: 0.2 10*3/uL (ref 0.0–0.7)
Eosinophils Relative: 3 % (ref 0–5)
HCT: 42.8 % (ref 39.0–52.0)
Hemoglobin: 14.3 g/dL (ref 13.0–17.0)
LYMPHS ABS: 1.6 10*3/uL (ref 0.7–4.0)
LYMPHS PCT: 22 % (ref 12–46)
MCH: 31.2 pg (ref 26.0–34.0)
MCHC: 33.4 g/dL (ref 30.0–36.0)
MCV: 93.4 fL (ref 78.0–100.0)
Monocytes Absolute: 0.7 10*3/uL (ref 0.1–1.0)
Monocytes Relative: 9 % (ref 3–12)
Neutro Abs: 4.9 10*3/uL (ref 1.7–7.7)
Neutrophils Relative %: 66 % (ref 43–77)
PLATELETS: 272 10*3/uL (ref 150–400)
RBC: 4.58 MIL/uL (ref 4.22–5.81)
RDW: 14.2 % (ref 11.5–15.5)
WBC: 7.4 10*3/uL (ref 4.0–10.5)

## 2014-04-15 LAB — URINALYSIS, ROUTINE W REFLEX MICROSCOPIC
BILIRUBIN URINE: NEGATIVE
Glucose, UA: NEGATIVE mg/dL
Hgb urine dipstick: NEGATIVE
KETONES UR: NEGATIVE mg/dL
LEUKOCYTES UA: NEGATIVE
Nitrite: NEGATIVE
PH: 7.5 (ref 5.0–8.0)
PROTEIN: NEGATIVE mg/dL
Specific Gravity, Urine: 1.019 (ref 1.005–1.030)
UROBILINOGEN UA: 0.2 mg/dL (ref 0.0–1.0)

## 2014-04-15 LAB — ETHANOL: Alcohol, Ethyl (B): <11 mg/dL (ref 0–11)

## 2014-04-15 MED ORDER — OXYCODONE-ACETAMINOPHEN 5-325 MG PO TABS
2.0000 | ORAL_TABLET | Freq: Once | ORAL | Status: AC
  Administered 2014-04-15: 2 via ORAL
  Filled 2014-04-15: qty 2

## 2014-04-15 MED ORDER — TRAZODONE HCL 50 MG PO TABS: 100.0000 mg | ORAL_TABLET | Freq: Every evening | ORAL | Status: DC | PRN

## 2014-04-15 MED ORDER — LORAZEPAM 1 MG PO TABS
1.0000 mg | ORAL_TABLET | Freq: Three times a day (TID) | ORAL | Status: DC | PRN
Start: 2014-04-15 — End: 2014-04-17
  Administered 2014-04-16: 1 mg via ORAL
  Filled 2014-04-15: qty 1

## 2014-04-15 MED ORDER — LAMOTRIGINE 25 MG PO TABS
25.0000 mg | ORAL_TABLET | Freq: Two times a day (BID) | ORAL | Status: DC
  Administered 2014-04-15 – 2014-04-16 (×2): 25 mg via ORAL
  Filled 2014-04-15 (×3): qty 1

## 2014-04-15 MED ORDER — ALUM & MAG HYDROXIDE-SIMETH 200-200-20 MG/5ML PO SUSP: 30.0000 mL | ORAL | Status: DC | PRN

## 2014-04-15 MED ORDER — ZOLPIDEM TARTRATE 5 MG PO TABS: 5.0000 mg | ORAL_TABLET | Freq: Every evening | ORAL | Status: DC | PRN

## 2014-04-15 MED ORDER — PRAZOSIN HCL 1 MG PO CAPS
1.0000 mg | ORAL_CAPSULE | Freq: Every day | ORAL | Status: DC
  Administered 2014-04-15: 1 mg via ORAL
  Filled 2014-04-15 (×2): qty 1

## 2014-04-15 MED ORDER — ONDANSETRON HCL 4 MG PO TABS: 4.0000 mg | ORAL_TABLET | Freq: Three times a day (TID) | ORAL | Status: DC | PRN

## 2014-04-15 MED ORDER — ACETAMINOPHEN 325 MG PO TABS: 650.0000 mg | ORAL_TABLET | ORAL | Status: DC | PRN

## 2014-04-15 MED ORDER — PANTOPRAZOLE SODIUM 40 MG PO TBEC
40.0000 mg | DELAYED_RELEASE_TABLET | Freq: Every day | ORAL | Status: DC
  Administered 2014-04-15 – 2014-04-16 (×2): 40 mg via ORAL
  Filled 2014-04-15 (×2): qty 1

## 2014-04-15 MED ORDER — GABAPENTIN 100 MG PO CAPS: 200.0000 mg | ORAL_CAPSULE | Freq: Three times a day (TID) | ORAL | Status: DC
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
## 2014-04-15 NOTE — ED Provider Notes (Signed)
CSN: [id number]     Arrival date & time 04/15/14  1517 History   First MD Initiated Contact with Patient 04/15/14 1653     Chief Complaint  Patient presents with  . Suicidal      HPI Pt in c/o SI x2 weeks, denies a specific plan, history of attempt in the past, states he has been compliant with his medication recently, pt with history of PTSD and states he has been having increased flash backs lately and that is bringing the thoughts on. Denies substance abuse, denies HI. Pt calm and cooperative in triage   Past Medical History  Diagnosis Date  . COPD (chronic obstructive pulmonary disease)   . Arthritis   . Seizures     only once 12/30/2012  . Depression   . Bipolar disorder   . ADD (attention deficit disorder)    Past Surgical History  Procedure Laterality Date  . Mandible fracture surgery    . Appendectomy     History reviewed. No pertinent family history. History  Substance Use Topics  . Smoking status: Current Every Day Smoker -- 1.00 packs/day    Types: Cigarettes  . Smokeless tobacco: Never Used  . Alcohol Use: No    Review of Systems All other systems reviewed and are negative   Allergies  Abilify; Aspirin; Benadryl; Ketorolac; Morphine and related; and Tramadol  Home Medications   Prior to Admission medications   Medication Sig Start Date End Date Taking? Authorizing Provider  ibuprofen (ADVIL,MOTRIN) 200 MG tablet Take 800 mg by mouth every 6 (six) hours as needed (pain).   Yes Historical Provider, MD  lamoTRIgine (LAMICTAL) 25 MG tablet Take 1 tablet (25 mg total) by mouth 2 (two) times daily. For mood stabilization 02/19/14  Yes Sanjuana KavaAgnes I Nwoko, NP  methylphenidate (RITALIN) 5 MG tablet Take 15 mg by mouth 2 (two) times daily with breakfast and lunch.   Yes Historical Provider, MD  Omega-3 Fatty Acids (FISH OIL PO) Take 1 capsule by mouth daily.   Yes Historical Provider, MD  Oxycodone HCl 10 MG TABS Take 10 mg by mouth every 4 (four) hours as needed (Pain).    Yes Historical Provider, MD  prazosin (MINIPRESS) 1 MG capsule Take 1 capsule (1 mg total) by mouth at bedtime. For night-mares 02/19/14  Yes Sanjuana KavaAgnes I Nwoko, NP  QUEtiapine (SEROQUEL) 400 MG tablet Take 400 mg by mouth at bedtime.   Yes Historical Provider, MD   BP 108/72  Pulse 58  Temp(Src) 97.9 F (36.6 C) (Oral)  Resp 16  SpO2 97% Physical Exam  Nursing note and vitals reviewed. Constitutional: He is oriented to person, place, and time. He appears well-developed and well-nourished. No distress.  HENT:  Head: Normocephalic and atraumatic.  Eyes: Pupils are equal, round, and reactive to light.  Neck: Normal range of motion.  Cardiovascular: Normal rate and intact distal pulses.   Pulmonary/Chest: No respiratory distress.  Abdominal: Normal appearance. He exhibits no distension.  Musculoskeletal: Normal range of motion.  Neurological: He is alert and oriented to person, place, and time. No cranial nerve deficit.  Skin: Skin is warm and dry. No rash noted.  Psychiatric: He has a normal mood and affect. His behavior is normal. He expresses homicidal and suicidal ideation. He expresses no suicidal plans and no homicidal plans.    ED Course  Procedures (including critical care time) Labs Review Labs Reviewed  COMPREHENSIVE METABOLIC PANEL - Abnormal; Notable for the following:    GFR calc non Af  Amer 81 (*)    All other components within normal limits  CBC WITH DIFFERENTIAL  URINE RAPID DRUG SCREEN (HOSP PERFORMED)  ETHANOL  URINALYSIS, ROUTINE W REFLEX MICROSCOPIC    Imaging Review No results found.   EKG Interpretation None      MDM   Final diagnoses:  MDD (major depressive disorder)        Nelia Shiobert L Adarsh Mundorf, MD 04/19/14 2330

## 2014-04-15 NOTE — ED Notes (Signed)
House coverage notified of need for sitter 

## 2014-04-15 NOTE — ED Provider Notes (Signed)
Notified that patient meets qualifications for inpatient psychiatric admission.  No beds at present time.  Disposition pending.  Fusako Tanabe L Calub Tarnow, MD 04/15/14 2356 

## 2014-04-15 NOTE — ED Notes (Signed)
Pt states he is leaving and going to [facility], encouraged to stay and receive, help, pt upset that he is waiting in a chair and not a bed, pt has not been seen by MD and is not IVC.

## 2014-04-15 NOTE — ED Notes (Signed)
Tele-psych at bedside.

## 2014-04-15 NOTE — BH Assessment (Signed)
Clinician consulted with Corey SievertSpencer Simon, NP who recommends pt for inpatient admission. [name], AC reported [facility]. TTS will seek placement at other facilities.  Clinician provided updates to [name].   [name], MSW, LCSW Triage Specialist [phone]

## 2014-04-15 NOTE — ED Notes (Signed)
Pt in c/o SI x2 weeks, denies a specific plan, history of attempt in the past, states he has been compliant with his medication recently, pt with history of PTSD and states he has been having increased flash backs lately and that is bringing the thoughts on. Denies substance abuse, denies HI. Pt calm and cooperative in triage

## 2014-04-16 DIAGNOSIS — F329 Major depressive disorder, single episode, unspecified: Secondary | ICD-10-CM

## 2014-04-16 MED ORDER — METHYLPHENIDATE HCL 5 MG PO TABS
15.0000 mg | ORAL_TABLET | Freq: Two times a day (BID) | ORAL | Status: DC
  Administered 2014-04-16 (×2): 15 mg via ORAL
  Filled 2014-04-16: qty 3

## 2014-04-16 MED ORDER — OXYCODONE-ACETAMINOPHEN 5-325 MG PO TABS
1.0000 | ORAL_TABLET | Freq: Four times a day (QID) | ORAL | Status: DC | PRN
  Administered 2014-04-16 (×3): 2 via ORAL
  Filled 2014-04-16 (×3): qty 2

## 2014-04-16 MED ORDER — METHYLPHENIDATE HCL 5 MG PO TABS
15.0000 mg | ORAL_TABLET | Freq: Two times a day (BID) | ORAL | Status: DC
  Filled 2014-04-16: qty 3

## 2014-04-16 MED ORDER — QUETIAPINE FUMARATE 200 MG PO TABS: 400.0000 mg | ORAL_TABLET | Freq: Every day | ORAL | Status: DC

## 2014-04-16 MED ORDER — IBUPROFEN 800 MG PO TABS
800.0000 mg | ORAL_TABLET | Freq: Four times a day (QID) | ORAL | Status: DC | PRN
  Administered 2014-04-16 (×3): 800 mg via ORAL
  Filled 2014-04-16 (×3): qty 1

## 2014-04-16 NOTE — ED Notes (Signed)
MD stated that he would come and see Pt. Before he left but Pt. Is not willing to wait. MD made aware. MD stated that Pt. Is ok to DC AMA at this time.

## 2014-04-16 NOTE — Progress Notes (Signed)
[facility] [phone]. Per Kathlyn there are available beds. Writer faxed referrals to [phone].   [facility] [phone]. Per Johnathan there are available beds. Writer faxed referrals to [phone].   [facility] [phone]. Per Rosalyn they are accepting patients from their wait list. Writer faxed referral so that the patient can be reviewed and then placed on their wait list. The fax number is [phone].   [facility] [phone], writer left a message with the intake coordinator (Darlene).   [facility] [phone]. Per Stevie, there are available beds. Writer faxed referrals to [phone].       

## 2014-04-16 NOTE — ED Notes (Signed)
CALLED BH TO CHECK ON DELAY IN GETTING PT REEVAL.

## 2014-04-16 NOTE — ED Provider Notes (Signed)
Patient watching TV, in no distress. Home medications we ordered. Patient awaiting placement.   Gerhard Munchobert Lockwood, MD 04/16/14 [phone]
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
## 2014-04-16 NOTE — ED Notes (Signed)
Patient up pacing back and forth asking to see the MD.  Explained MD is currently with other patients.  Tried to reassure patient and redirect back to room, patient wants to stand at door and wait for MD.  Ophelia CharterSitter at bedside.

## 2014-04-16 NOTE — ED Notes (Signed)
Patient given decaf coffee per request and approval from nurse

## 2014-04-16 NOTE — ED Notes (Signed)
PT HAS COMPLETED HIS TTS. AWAITING DISPOSITION

## 2014-04-16 NOTE — Consult Note (Signed)
Case discussed, I agree with plan 

## 2014-04-26 ENCOUNTER — Emergency Department (HOSPITAL_COMMUNITY)
Admission: EM | Admit: 2014-04-26 | Discharge: 2014-04-27 | Disposition: A | Discharge status: Home / Self Care | Payer: Medicaid Other | Attending: Emergency Medicine | Admitting: Emergency Medicine

## 2014-04-26 ENCOUNTER — Encounter (HOSPITAL_COMMUNITY): Payer: Self-pay | Admitting: Emergency Medicine

## 2014-04-26 ENCOUNTER — Emergency Department (HOSPITAL_COMMUNITY): Payer: Medicaid Other

## 2014-04-26 DIAGNOSIS — J449 Chronic obstructive pulmonary disease, unspecified: Secondary | ICD-10-CM | POA: Insufficient documentation

## 2014-04-26 DIAGNOSIS — W010XXA Fall on same level from slipping, tripping and stumbling without subsequent striking against object, initial encounter: Secondary | ICD-10-CM | POA: Insufficient documentation

## 2014-04-26 DIAGNOSIS — M545 Low back pain, unspecified: Secondary | ICD-10-CM | POA: Insufficient documentation

## 2014-04-26 DIAGNOSIS — Z885 Allergy status to narcotic agent status: Secondary | ICD-10-CM | POA: Insufficient documentation

## 2014-04-26 DIAGNOSIS — R569 Unspecified convulsions: Secondary | ICD-10-CM | POA: Insufficient documentation

## 2014-04-26 DIAGNOSIS — Y939 Activity, unspecified: Secondary | ICD-10-CM | POA: Insufficient documentation

## 2014-04-26 DIAGNOSIS — F988 Other specified behavioral and emotional disorders with onset usually occurring in childhood and adolescence: Secondary | ICD-10-CM | POA: Insufficient documentation

## 2014-04-26 DIAGNOSIS — F17200 Nicotine dependence, unspecified, uncomplicated: Secondary | ICD-10-CM | POA: Insufficient documentation

## 2014-04-26 DIAGNOSIS — F319 Bipolar disorder, unspecified: Secondary | ICD-10-CM | POA: Insufficient documentation

## 2014-04-26 DIAGNOSIS — Z79899 Other long term (current) drug therapy: Secondary | ICD-10-CM | POA: Insufficient documentation

## 2014-04-26 DIAGNOSIS — J4489 Other specified chronic obstructive pulmonary disease: Secondary | ICD-10-CM | POA: Insufficient documentation

## 2014-04-26 DIAGNOSIS — Z888 Allergy status to other drugs, medicaments and biological substances status: Secondary | ICD-10-CM | POA: Insufficient documentation

## 2014-04-26 DIAGNOSIS — M25559 Pain in unspecified hip: Secondary | ICD-10-CM | POA: Insufficient documentation

## 2014-04-26 DIAGNOSIS — F329 Major depressive disorder, single episode, unspecified: Secondary | ICD-10-CM | POA: Insufficient documentation

## 2014-04-26 DIAGNOSIS — M129 Arthropathy, unspecified: Secondary | ICD-10-CM | POA: Insufficient documentation

## 2014-04-26 DIAGNOSIS — G8929 Other chronic pain: Secondary | ICD-10-CM | POA: Insufficient documentation

## 2014-04-26 DIAGNOSIS — IMO0002 Reserved for concepts with insufficient information to code with codable children: Secondary | ICD-10-CM | POA: Insufficient documentation

## 2014-04-26 DIAGNOSIS — Y9289 Other specified places as the place of occurrence of the external cause: Secondary | ICD-10-CM | POA: Insufficient documentation

## 2014-04-26 DIAGNOSIS — F3289 Other specified depressive episodes: Secondary | ICD-10-CM | POA: Insufficient documentation

## 2014-04-26 DIAGNOSIS — M5432 Sciatica, left side: Secondary | ICD-10-CM

## 2014-04-26 DIAGNOSIS — Z884 Allergy status to anesthetic agent status: Secondary | ICD-10-CM | POA: Insufficient documentation

## 2014-04-26 DIAGNOSIS — W19XXXA Unspecified fall, initial encounter: Secondary | ICD-10-CM

## 2014-04-26 DIAGNOSIS — M5430 Sciatica, unspecified side: Secondary | ICD-10-CM | POA: Insufficient documentation

## 2014-04-26 NOTE — ED Notes (Signed)
[facility] / [facility] , reports pain at lower back , bilateral hip and left leg.

## 2014-04-26 NOTE — ED Notes (Signed)
Pt st's he fell 2 days ago.  C/O pain in lower back radiating down left leg.

## 2014-04-27 ENCOUNTER — Encounter (HOSPITAL_COMMUNITY): Payer: Self-pay | Admitting: Emergency Medicine

## 2014-04-27 ENCOUNTER — Emergency Department (HOSPITAL_COMMUNITY)
Admission: EM | Admit: 2014-04-27 | Discharge: 2014-04-27 | Discharge status: Against Medical Advice | Payer: Medicaid Other | Source: Home / Self Care

## 2014-04-27 DIAGNOSIS — J4489 Other specified chronic obstructive pulmonary disease: Secondary | ICD-10-CM | POA: Insufficient documentation

## 2014-04-27 DIAGNOSIS — J449 Chronic obstructive pulmonary disease, unspecified: Secondary | ICD-10-CM | POA: Insufficient documentation

## 2014-04-27 DIAGNOSIS — Z760 Encounter for issue of repeat prescription: Secondary | ICD-10-CM

## 2014-04-27 DIAGNOSIS — F17200 Nicotine dependence, unspecified, uncomplicated: Secondary | ICD-10-CM

## 2014-04-27 MED ORDER — OXYCODONE-ACETAMINOPHEN 5-325 MG PO TABS
1.0000 | ORAL_TABLET | Freq: Once | ORAL | Status: AC
  Administered 2014-04-27: 1 via ORAL
  Filled 2014-04-27: qty 1

## 2014-04-27 MED ORDER — OXYCODONE-ACETAMINOPHEN 5-325 MG PO TABS: 1.0000 | ORAL_TABLET | Freq: Three times a day (TID) | ORAL | Status: DC | PRN

## 2014-04-27 MED ORDER — PREDNISONE 20 MG PO TABS: ORAL_TABLET | ORAL | Status: DC

## 2014-04-27 NOTE — ED Notes (Signed)
Pt called 2nd time, no response.

## 2014-04-27 NOTE — Discharge Instructions (Signed)
Please call your doctor for a followup appointment within 24-48 hours. When you talk to your doctor please let them know that you were seen in the emergency department and have them acquire all of your records so that they can discuss the findings with you and formulate a treatment plan to fully care for your new and ongoing problems. Please call and set up an appointment with orthopedics, Dr. Carola FrostHandy Please rest and stay hydrated Please take medications as prescribed-while on pain medications there is be no drinking alcohol, driving, operating any heavy machinery if there is extra please disposer proper manner. Please avoid any physical or shortness activity-please no heavy lifting. Please acquire a back brace to aid in comfort of the back Please massage with icy hot ointment Please continue monitor symptoms closely and if symptoms are to worsen or change (fever greater than 101, chills, chest pain, shortness breath, difficulty breathing, fall, injury, numbness, tingling, inability to your bowel movements, weakness, sudden loss of sensation) please report back to the ED immediately  Sciatica Sciatica is pain, weakness, numbness, or tingling along the path of the sciatic nerve. The nerve starts in the lower back and runs down the back of each leg. The nerve controls the muscles in the lower leg and in the back of the knee, while also providing sensation to the back of the thigh, lower leg, and the sole of your foot. Sciatica is a symptom of another medical condition. For instance, nerve damage or certain conditions, such as a herniated disk or bone spur on the spine, pinch or put pressure on the sciatic nerve. This causes the pain, weakness, or other sensations normally associated with sciatica. Generally, sciatica only affects one side of the body. CAUSES   Herniated or slipped disc.  Degenerative disk disease.  A pain disorder involving the narrow muscle in the buttocks (piriformis  syndrome).  Pelvic injury or fracture.  Pregnancy.  Tumor (rare). SYMPTOMS  Symptoms can vary from mild to very severe. The symptoms usually travel from the low back to the buttocks and down the back of the leg. Symptoms can include:  Mild tingling or dull aches in the lower back, leg, or hip.  Numbness in the back of the calf or sole of the foot.  Burning sensations in the lower back, leg, or hip.  Sharp pains in the lower back, leg, or hip.  Leg weakness.  Severe back pain inhibiting movement. These symptoms may get worse with coughing, sneezing, laughing, or prolonged sitting or standing. Also, being overweight may worsen symptoms. DIAGNOSIS  Your caregiver will perform a physical exam to look for common symptoms of sciatica. He or she may ask you to do certain movements or activities that would trigger sciatic nerve pain. Other tests may be performed to find the cause of the sciatica. These may include:  Blood tests.  X-rays.  Imaging tests, such as an MRI or CT scan. TREATMENT  Treatment is directed at the cause of the sciatic pain. Sometimes, treatment is not necessary and the pain and discomfort goes away on its own. If treatment is needed, your caregiver may suggest:  Over-the-counter medicines to relieve pain.  Prescription medicines, such as anti-inflammatory medicine, muscle relaxants, or narcotics.  Applying heat or ice to the painful area.  Steroid injections to lessen pain, irritation, and inflammation around the nerve.  Reducing activity during periods of pain.  Exercising and stretching to strengthen your abdomen and improve flexibility of your spine. Your caregiver may suggest losing  weight if the extra weight makes the back pain worse.  Physical therapy.  Surgery to eliminate what is pressing or pinching the nerve, such as a bone spur or part of a herniated disk. HOME CARE INSTRUCTIONS   Only take over-the-counter or prescription medicines for pain  or discomfort as directed by your caregiver.  Apply ice to the affected area for 20 minutes, 3-4 times a day for the first 48-72 hours. Then try heat in the same way.  Exercise, stretch, or perform your usual activities if these do not aggravate your pain.  Attend physical therapy sessions as directed by your caregiver.  Keep all follow-up appointments as directed by your caregiver.  Do not wear high heels or shoes that do not provide proper support.  Check your mattress to see if it is too soft. A firm mattress may lessen your pain and discomfort. SEEK IMMEDIATE MEDICAL CARE IF:   You lose control of your bowel or bladder (incontinence).  You have increasing weakness in the lower back, pelvis, buttocks, or legs.  You have redness or swelling of your back.  You have a burning sensation when you urinate.  You have pain that gets worse when you lie down or awakens you at night.  Your pain is worse than you have experienced in the past.  Your pain is lasting longer than 4 weeks.  You are suddenly losing weight without reason. MAKE SURE YOU:  Understand these instructions.  Will watch your condition.  Will get help right away if you are not doing well or get worse. Document Released: 10/12/2001 Document Revised: 04/18/2012 Document Reviewed: 02/27/2012 [facility] Patient Information 2015 [place], [place]. This information is not intended to replace advice given to you by your health care provider. Make sure you discuss any questions you have with your health care provider.  Back Exercises Back exercises help treat and prevent back injuries. The goal is to increase your strength in your belly (abdominal) and back muscles. These exercises can also help with flexibility. Start these exercises when told by your doctor. HOME CARE Back exercises include: Pelvic Tilt.  Lie on your back with your knees bent. Tilt your pelvis until the lower part of your back is against the floor. Hold  this position 5 to 10 sec. Repeat this exercise 5 to 10 times. Knee to Chest.  Pull 1 knee up against your chest and hold for 20 to 30 seconds. Repeat this with the other knee. This may be done with the other leg straight or bent, whichever feels better. Then, pull both knees up against your chest. Sit-Ups or Curl-Ups.  Bend your knees 90 degrees. Start with tilting your pelvis, and do a partial, slow sit-up. Only lift your upper half 30 to 45 degrees off the floor. Take at least 2 to 3 seonds for each sit-up. Do not do sit-ups with your knees out straight. If partial sit-ups are difficult, simply do the above but with only tightening your belly (abdominal) muscles and holding it as told. Hip-Lift.  Lie on your back with your knees flexed 90 degrees. Push down with your feet and shoulders as you raise your hips 2 inches off the floor. Hold for 10 seconds, repeat 5 to 10 times. Back Arches.  Lie on your stomach. Prop yourself up on bent elbows. Slowly press on your hands, causing an arch in your low back. Repeat 3 to 5 times. Shoulder-Lifts.  Lie face down with arms beside your body. Keep hips and belly  pressed to floor as you slowly lift your head and shoulders off the floor. Do not overdo your exercises. Be careful in the beginning. Exercises may cause you some mild back discomfort. If the pain lasts for more than 15 minutes, stop the exercises until you see your doctor. Improvement with exercise for back problems is slow.  Document Released: 11/20/2010 Document Revised: 01/10/2012 Document Reviewed: 08/19/2011 [facility] Patient Information 2015 [place], [place]. This information is not intended to replace advice given to you by your health care provider. Make sure you discuss any questions you have with your health care provider. Back Pain, Adult Back pain is very common. The pain often gets better over time. The cause of back pain is usually not dangerous. Most people can learn to manage their  back pain on their own.  HOME CARE   Stay active. Start with short walks on flat ground if you can. Try to walk farther each day.  Do not sit, drive, or stand in one place for more than 30 minutes. Do not stay in bed.  Do not avoid exercise or work. Activity can help your back heal faster.  Be careful when you bend or lift an object. Bend at your knees, keep the object close to you, and do not twist.  Sleep on a firm mattress. Lie on your side, and bend your knees. If you lie on your back, put a pillow under your knees.  Only take medicines as told by your doctor.  Put ice on the injured area.  Put ice in a plastic bag.  Place a towel between your skin and the bag.  Leave the ice on for 15-20 minutes, 03-04 times a day for the first 2 to 3 days. After that, you can switch between ice and heat packs.  Ask your doctor about back exercises or massage.  Avoid feeling anxious or stressed. Find good ways to deal with stress, such as exercise. GET HELP RIGHT AWAY IF:   Your pain does not go away with rest or medicine.  Your pain does not go away in 1 week.  You have new problems.  You do not feel well.  The pain spreads into your legs.  You cannot control when you poop (bowel movement) or pee (urinate).  Your arms or legs feel weak or lose feeling (numbness).  You feel sick to your stomach (nauseous) or throw up (vomit).  You have belly (abdominal) pain.  You feel like you may pass out (faint). MAKE SURE YOU:   Understand these instructions.  Will watch your condition.  Will get help right away if you are not doing well or get worse. Document Released: 04/05/2008 Document Revised: 01/10/2012 Document Reviewed: 03/08/2011 [facility] Patient Information 2015 [place], [place]. This information is not intended to replace advice given to you by your health care provider. Make sure you discuss any questions you have with your health care provider.

## 2014-04-27 NOTE — ED Provider Notes (Signed)
CSN: [id number]     Arrival date & time 04/26/14  2100 History   First MD Initiated Contact with Patient 04/26/14 2353     Chief Complaint  Patient presents with  . Fall     (Consider location/radiation/quality/duration/timing/severity/associated sxs/prior Treatment) The history is provided by the patient. No language interpreter was used.  [name] is a 43 year old male with past medical history of COPD, arthritis, seizures, bipolar disorder, ADD presenting to the ED with lower back pain and left hip pain that started after a [place] that [place] [employer] Patient stated that he walked into a portion of the floor that was wet and twisted falling on his coccyx. Describes the left hip and lower back pain as a sharp pain that is constant. Stated he's been using Neurontin, Aleve and ibuprofen in relief. Stated that the discomfort is localized to lower portion of the back radiating towards the left hip and down the left leg. Stated that he has been dealing with back pain for many years - reported that he does experience left radicular pain with episodes of back pain. Denied head injury, loss of consciousness, dizziness, sudden loss of vision, blurred vision, numbness, tingling, weakness, loss of sensation, urinary and bowel incontinence. PCP none  Past Medical History  Diagnosis Date  . COPD (chronic obstructive pulmonary disease)   . Arthritis   . Seizures     only once 12/30/2012  . Depression   . Bipolar disorder   . ADD (attention deficit disorder)    Past Surgical History  Procedure Laterality Date  . Mandible fracture surgery    . Appendectomy     No family history on file. History  Substance Use Topics  . Smoking status: Current Every Day Smoker -- 1.00 packs/day    Types: Cigarettes  . Smokeless tobacco: Never Used  . Alcohol Use: No    Review of Systems  Eyes: Negative for visual disturbance.  Musculoskeletal: Positive for arthralgias (Left hip)  and back pain. Negative for joint swelling, neck pain and neck stiffness.  Neurological: Negative for weakness, numbness and headaches.      Allergies  Abilify; Aspirin; Benadryl; Ketorolac; Morphine and related; and Tramadol  Home Medications   Prior to Admission medications   Medication Sig Start Date End Date Taking? Authorizing Provider  ibuprofen (ADVIL,MOTRIN) 200 MG tablet Take 800 mg by mouth every 6 (six) hours as needed (pain).    Historical Provider, MD  lamoTRIgine (LAMICTAL) 25 MG tablet Take 1 tablet (25 mg total) by mouth 2 (two) times daily. For mood stabilization 02/19/14   Sanjuana KavaAgnes I Nwoko, NP  methylphenidate (RITALIN) 5 MG tablet Take 15 mg by mouth 2 (two) times daily with breakfast and lunch.    Historical Provider, MD  Omega-3 Fatty Acids (FISH OIL PO) Take 1 capsule by mouth daily.    Historical Provider, MD  Oxycodone HCl 10 MG TABS Take 10 mg by mouth every 4 (four) hours as needed (Pain).    Historical Provider, MD  oxyCODONE-acetaminophen (PERCOCET/ROXICET) 5-325 MG per tablet Take 1 tablet by mouth every 8 (eight) hours as needed for moderate pain or severe pain. 04/27/14   Marissa Sciacca, PA-C  prazosin (MINIPRESS) 1 MG capsule Take 1 capsule (1 mg total) by mouth at bedtime. For night-mares 02/19/14   Sanjuana KavaAgnes I Nwoko, NP  predniSONE (DELTASONE) 20 MG tablet 3 tabs po day one, then 2 tabs daily x 4 days 04/27/14   Marissa Sciacca, PA-C  QUEtiapine (  SEROQUEL) 400 MG tablet Take 400 mg by mouth at bedtime.    Historical Provider, MD   BP 111/75  Pulse 63  Temp(Src) 98.5 F (36.9 C) (Oral)  Resp 14  Ht 6\' 2"  (1.88 m)  Wt 230 lb (104.327 kg)  BMI 29.52 kg/m2  SpO2 98% Physical Exam  Nursing note and vitals reviewed. Constitutional: He is oriented to person, place, and time. He appears well-developed and well-nourished. No distress.  HENT:  Head: Normocephalic and atraumatic.  Mouth/Throat: Oropharynx is clear and moist. No oropharyngeal exudate.  Eyes:  Conjunctivae and EOM are normal. Pupils are equal, round, and reactive to light. Right eye exhibits no discharge. Left eye exhibits no discharge.  Neck: Normal range of motion. Neck supple. No tracheal deviation present.  Negative neck stiffness Negative nuchal rigidity Negative pain upon palpation to C-spine  Cardiovascular: Normal rate, regular rhythm and normal heart sounds.  Exam reveals no friction rub.   No murmur heard. Pulses:      Radial pulses are 2+ on the right side, and 2+ on the left side.       Dorsalis pedis pulses are 2+ on the right side, and 2+ on the left side.  Pulmonary/Chest: Effort normal and breath sounds normal. No respiratory distress. He has no wheezes. He has no rales.  Musculoskeletal: Normal range of motion. He exhibits tenderness.       Lumbar back: He exhibits tenderness. He exhibits normal range of motion, no bony tenderness, no swelling, no edema, no deformity, no laceration and no pain.       Back:  Negative deformities identified to the spine. Discomfort upon palpation to the mid lumbosacral region and paravertebral regions bilaterally, left more so than right. Mild discomfort upon palpation to the left acetabulum with range of motion to the left hip without difficulty. Negative pelvic pain. Full ROM to upper and lower extremities without difficulty noted, negative ataxia noted.  Lymphadenopathy:    He has no cervical adenopathy.  Neurological: He is alert and oriented to person, place, and time. No cranial nerve deficit. He exhibits normal muscle tone. Coordination normal.  Cranial nerves III-XII grossly intact Strength 5+/5+ to upper and lower extremities bilaterally with resistance applied, equal distribution noted Strength intact to the digits of the feet bilaterally Equal grip strength bilaterally Sensation intact with differentiation sharp and dull touch Gait proper, proper balance - negative sway, negative drift, negative step-offs  Skin: Skin is  warm and dry. No rash noted. He is not diaphoretic. No erythema.  Psychiatric: He has a normal mood and affect. His behavior is normal. Thought content normal.    ED Course  Procedures (including critical care time) Labs Review Labs Reviewed - No data to display  Imaging Review Dg Lumbar Spine Complete  04/26/2014   CLINICAL DATA:  Low back pain after fall.  EXAM: LUMBAR SPINE - COMPLETE 4+ VIEW  COMPARISON:  None.  FINDINGS: There is no evidence of lumbar spine fracture. Alignment is normal. Intervertebral disc spaces are maintained.  IMPRESSION: Normal lumbar spine.   Electronically Signed   By: Roque LiasJames  Green M.D.   On: 04/26/2014 22:06   Dg Hip Bilateral W/pelvis  04/26/2014   CLINICAL DATA:  Bilateral hip pain after fall.  EXAM: BILATERAL HIP WITH PELVIS - 4+ VIEW  COMPARISON:  None.  FINDINGS: No fracture or dislocation is noted. Bilateral hip and sacroiliac joints appear normal. No significant degenerative changes noted.  IMPRESSION: Normal pelvis and hips.   Electronically Signed  By: Roque Lias M.D.   On: 04/26/2014 22:07     EKG Interpretation None      MDM   Final diagnoses:  Acute exacerbation of chronic low back pain  Fall, initial encounter  Sciatica, left    Medications  oxyCODONE-acetaminophen (PERCOCET/ROXICET) 5-325 MG per tablet 1 tablet (1 tablet Oral Given 04/27/14 0039)   Filed Vitals:   04/26/14 2115 04/27/14 0111  BP: 111/63 111/75  Pulse: 61 63  Temp: 98.5 F (36.9 C)   TempSrc: Oral   Resp: 14   Height: 6\' 2"  (1.88 m)   Weight: 230 lb (104.327 kg)   SpO2: 99% 98%   This provider reviewed patient's chart. Patient has been seen and assessed in ED setting regarding lumbar back pain. Patient reports he's been battling with lower back pain for many years. Stated he's been followed up with orthopedics, but reports that when the back pain is alleviated he can stop seeing orthopedics. Stated that he needs to continue following up with orthopedics  outpatient. Plain film of lumbar spine negative for acute osseous injury-there is no evidence of lumbar spine fracture, alignment is normal. Intervertebral disc spaces are maintained. Bilateral hip with pelvis negative for acute fracture dislocation-bilateral hip and sacroiliac joints appear normal with significant degenerative changes identified. Negative focal neurological deficits noted. Pulses palpable and strong. Sensation intact. Strength intact and equal distribution. Gait noted to have mild limp when applying pressure to the left lower extremity - negative ataxic gait. Full range of motion to upper lower extremities bilaterally without difficulty or ataxia noted. Patient has history of lower back pain. Suspicion to be acute exacerbation of chronic back pain secondary to fall. Chronic sciatica of the left side. Doubt cauda equina. Doubt epidural abscess. Patient stable, afebrile. Patient not septic appearing. Discharged patient. Discharge patient with pain medications-discussed course, precautions, disposal technique and prednisone. Discussed with patient to avoid any physical shortness activity. Discussed with patient to rest and stay hydrated, recommended massage water therapy. [facility]. Discussed with patient to closely monitor symptoms and if symptoms are to worsen or change to report back to the ED - strict return instructions given.  Patient agreed to plan of care, understood, all questions answered.   Raymon Mutton, PA-C 04/27/14 1327

## 2014-04-27 NOTE — ED Notes (Signed)
Patient is requesting medication refill on Lamictal, minipress, trazadone,neurontin, and ritalin. Patient states he has been off of his medications x 1 week.

## 2014-04-27 NOTE — ED Notes (Signed)
Charge went and called for pt, no response in lobby or outside.

## 2014-04-28 NOTE — ED Provider Notes (Signed)
Medical screening examination/treatment/procedure(s) were performed by non-physician practitioner and as supervising physician I was immediately available for consultation/collaboration.   EKG Interpretation None        Enid SkeensJoshua M Zavitz, MD 04/28/14 [phone]

## 2014-04-29 ENCOUNTER — Emergency Department (HOSPITAL_COMMUNITY)
Admission: EM | Admit: 2014-04-29 | Discharge: 2014-04-29 | Discharge status: Against Medical Advice | Payer: Medicaid Other

## 2014-04-29 ENCOUNTER — Emergency Department (HOSPITAL_COMMUNITY)
Admission: EM | Admit: 2014-04-29 | Discharge: 2014-04-29 | Disposition: A | Discharge status: Home / Self Care | Payer: Medicaid Other

## 2014-04-29 NOTE — ED Notes (Signed)
Unable to locate patient when called for triage x3, called multiple times over the last 15 min

## 2014-04-29 NOTE — ED Notes (Signed)
Called mobile phone and has been disconnected.  Called home phone and left message to call us back-no details attached.

## 2014-04-29 NOTE — ED Notes (Signed)
Pt was paged again with no response; looked in main ED waiting area bathrooms and outside; could not find pt; triage RNs aware

## 2014-04-29 NOTE — ED Notes (Signed)
Pt paged multiple times, not in lobby

## 2014-04-29 NOTE — ED Notes (Signed)
Pt was paged and did not respond; could not find the pt in the main ED waiting area; secretary knows what pt looks like and thinks he went outside

## 2014-05-25 ENCOUNTER — Emergency Department (HOSPITAL_COMMUNITY): Payer: Medicaid Other

## 2014-05-25 ENCOUNTER — Emergency Department (HOSPITAL_COMMUNITY)
Admission: EM | Admit: 2014-05-25 | Discharge: 2014-05-25 | Disposition: A | Discharge status: Home / Self Care | Payer: Medicaid Other | Attending: Emergency Medicine | Admitting: Emergency Medicine

## 2014-05-25 ENCOUNTER — Encounter (HOSPITAL_COMMUNITY): Payer: Self-pay | Admitting: Emergency Medicine

## 2014-05-25 DIAGNOSIS — J449 Chronic obstructive pulmonary disease, unspecified: Secondary | ICD-10-CM | POA: Diagnosis not present

## 2014-05-25 DIAGNOSIS — F17200 Nicotine dependence, unspecified, uncomplicated: Secondary | ICD-10-CM | POA: Diagnosis not present

## 2014-05-25 DIAGNOSIS — G40909 Epilepsy, unspecified, not intractable, without status epilepticus: Secondary | ICD-10-CM | POA: Insufficient documentation

## 2014-05-25 DIAGNOSIS — M545 Low back pain, unspecified: Secondary | ICD-10-CM | POA: Diagnosis present

## 2014-05-25 DIAGNOSIS — IMO0002 Reserved for concepts with insufficient information to code with codable children: Secondary | ICD-10-CM | POA: Insufficient documentation

## 2014-05-25 DIAGNOSIS — M5416 Radiculopathy, lumbar region: Secondary | ICD-10-CM

## 2014-05-25 DIAGNOSIS — J4489 Other specified chronic obstructive pulmonary disease: Secondary | ICD-10-CM | POA: Insufficient documentation

## 2014-05-25 DIAGNOSIS — M129 Arthropathy, unspecified: Secondary | ICD-10-CM | POA: Diagnosis not present

## 2014-05-25 DIAGNOSIS — F319 Bipolar disorder, unspecified: Secondary | ICD-10-CM | POA: Diagnosis not present

## 2014-05-25 DIAGNOSIS — Z79899 Other long term (current) drug therapy: Secondary | ICD-10-CM | POA: Diagnosis not present

## 2014-05-25 MED ORDER — HYDROCODONE-ACETAMINOPHEN 5-325 MG PO TABS: 1.0000 | ORAL_TABLET | ORAL | Status: DC | PRN

## 2014-05-25 MED ORDER — CYCLOBENZAPRINE HCL 5 MG PO TABS: 5.0000 mg | ORAL_TABLET | Freq: Three times a day (TID) | ORAL | Status: DC | PRN

## 2014-05-25 MED ORDER — PREDNISONE 10 MG PO TABS: ORAL_TABLET | ORAL | Status: DC

## 2014-05-25 MED ORDER — HYDROMORPHONE HCL PF 1 MG/ML IJ SOLN
1.0000 mg | Freq: Once | INTRAMUSCULAR | Status: AC
  Administered 2014-05-25: 1 mg via INTRAMUSCULAR
  Filled 2014-05-25: qty 1

## 2014-05-25 NOTE — ED Provider Notes (Signed)
CSN: [id number]     Arrival date & time 05/25/14  1054 History   First MD Initiated Contact with Patient 05/25/14 1128     Chief Complaint  Patient presents with  . Back Pain     (Consider location/radiation/quality/duration/timing/severity/associated sxs/prior Treatment) The history is provided by the patient.    [name] is a 43 y.o. male presenting with acute on chronic low back pain.  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  He denies weakness or numbness in his legs and has had no urinary or fecal incontinence or retention since this new injury.  He is having difficulty finding a local primary doctor.  Recently moved back here from [place] where he was taking care of parents and has been unsuccessful getting his medicaid switched to a local provider.  He has taken no medicines prior to arrival but is on chronic gabapentin for his nerve pain.  His pain is constant but worse with movement.  He is unable to find a comfortable position.     Past Medical History  Diagnosis Date  . COPD (chronic obstructive pulmonary disease)   . Arthritis   . Seizures     only once 12/30/2012  . Depression   . Bipolar disorder   . ADD (attention deficit disorder)    Past Surgical History  Procedure Laterality Date  . Mandible fracture surgery    . Appendectomy     History reviewed. No pertinent family history. History  Substance Use Topics  . Smoking status: Current Every Day Smoker -- 1.00 packs/day    Types: Cigarettes  . Smokeless tobacco: Never Used  . Alcohol Use: No    Review of Systems  Constitutional: Negative for fever.  Respiratory: Negative for shortness of breath.   Cardiovascular: Negative for chest pain and leg swelling.   Gastrointestinal: Negative for abdominal pain, constipation and abdominal distention.  Genitourinary: Negative for dysuria, urgency, frequency, flank pain and difficulty urinating.  Musculoskeletal: Positive for back pain. Negative for gait problem and joint swelling.  Skin: Negative for rash.  Neurological: Negative for weakness and numbness.      Allergies  Aspirin; Abilify; Benadryl; Ketorolac; Morphine and related; and Tramadol  Home Medications   Prior to Admission medications   Medication Sig Start Date End Date Taking? Authorizing Provider  b complex vitamins tablet Take 1 tablet by mouth daily.   Yes Historical Provider, MD  CALCIUM-MAGNESIUM-ZINC PO Take 1 tablet by mouth daily.   Yes Historical Provider, MD  gabapentin (NEURONTIN) 400 MG capsule Take 400 mg by mouth 3 (three) times daily.   Yes Historical Provider, MD  lamoTRIgine (LAMICTAL) 25 MG tablet Take 1 tablet (25 mg total) by mouth 2 (two) times daily. For mood stabilization 02/19/14  Yes Sanjuana Kava, NP  Omega-3 Fatty Acids (FISH OIL PO) Take 1 capsule by mouth daily.   Yes Historical Provider, MD  traZODone (DESYREL) 100 MG tablet Take 100 mg by mouth at bedtime.   Yes Historical Provider, MD  cyclobenzaprine (FLEXERIL) 5 MG tablet Take 1 tablet (5 mg total) by mouth 3 (three) times daily as needed for muscle spasms. 05/25/14   Burgess Amor, PA-C  HYDROcodone-acetaminophen (NORCO/VICODIN) 5-325 MG per tablet Take 1 tablet by mouth every 4 (  four) hours as needed. 05/25/14   Burgess AmorJulie Aara Jacquot, PA-C  predniSONE (DELTASONE) 10 MG tablet 6, 5, 4, 3, 2 then 1 tablet by mouth daily for 6 days total. 05/25/14   Burgess AmorJulie Doyl Bitting, PA-C   BP 108/63  Pulse 65  Temp(Src) 98 F (36.7 C) (Oral)  Resp 18  SpO2 100% Physical Exam  Nursing note and vitals reviewed. Constitutional: He is oriented to person, place, and time. He appears well-developed and well-nourished.  HENT:  Head: Normocephalic.  Eyes: Conjunctivae are normal.  Neck:  Normal range of motion. Neck supple.  Cardiovascular: Normal rate and intact distal pulses.   Pedal pulses normal.  Pulmonary/Chest: Effort normal.  Abdominal: Soft. Bowel sounds are normal. He exhibits no distension and no mass.  Musculoskeletal: Normal range of motion. He exhibits no edema.       Lumbar back: He exhibits tenderness and bony tenderness. He exhibits no swelling, no edema and no spasm.  Neurological: He is alert and oriented to person, place, and time. He has normal strength. He displays no atrophy and no tremor. No sensory deficit. Gait normal.  Reflex Scores:      Patellar reflexes are 2+ on the right side and 2+ on the left side.      Achilles reflexes are 2+ on the right side and 2+ on the left side. No strength deficit noted in hip and knee flexor and extensor muscle groups.  Ankle flexion and extension intact.  Skin: Skin is warm and dry.  Psychiatric: He has a normal mood and affect.    ED Course  Procedures (including critical care time) Labs Review Labs Reviewed - No data to display  Imaging Review Dg Lumbar Spine Complete  05/25/2014   CLINICAL DATA:  Lower back pain, lifting injury  EXAM: LUMBAR SPINE - COMPLETE 4+ VIEW  COMPARISON:  04/26/2014  FINDINGS: There is no evidence of lumbar spine fracture. Alignment is normal. Intervertebral disc spaces are maintained.  IMPRESSION: Negative.   Electronically Signed   By: Ruel Favorsrevor  Shick M.D.   On: 05/25/2014 14:17     EKG Interpretation None      MDM   Final diagnoses:  Lumbar radiculopathy, chronic    Patients labs and/or radiological studies were viewed and considered during the medical decision making and disposition process. No vertebral fx or spondylosis.  Pt with acute on chronic lumbar radiculopathy.  He states prednisone has been helpful in the past.  He was placed on a 6 day taper.  Prescribed oxycodone, flexeril,  Advised heat tx, activity as tolerated.  Referrals given for f/u care/pcp.  Encouraged  to get medicaid issue resolved.    No neuro deficit on exam or by history to suggest emergent or surgical presentation.  Also discussed worsened sx that should prompt immediate re-evaluation including distal weakness, bowel/bladder retention/incontinence.          Burgess AmorJulie Acea Yagi, PA-C 05/25/14 1725

## 2014-05-25 NOTE — ED Notes (Signed)
Back pain started yesterday.  Was lifting heavy bags at work yesterday and heard something pop.  Rating pain 7.  Taking Advil and aleve with no relief.  Pain radiating down both legs.

## 2014-05-25 NOTE — ED Notes (Signed)
Patient states he has not seen any ortho or neuro MD d/t his Medicaid being in a different county and he has not had any success getting anyone to help get it transferred..  States he will have BC/BS when work insurance starts and will be able to see MD at that point.

## 2014-05-25 NOTE — ED Provider Notes (Signed)
Medical screening examination/treatment/procedure(s) were performed by non-physician practitioner and as supervising physician I was immediately available for consultation/collaboration.   EKG Interpretation None      Ethelwyn Gilbertson, MD, FACEP   Harl Wiechmann L Leandro Berkowitz, MD 05/25/14 1725 

## 2014-05-25 NOTE — Discharge Instructions (Signed)
Lumbosacral Radiculopathy Lumbosacral radiculopathy is a pinched nerve or nerves in the low back (lumbosacral area). When this happens you may have weakness in your legs and may not be able to stand on your toes. You may have pain going down into your legs. There may be difficulties with walking normally. There are many causes of this problem. Sometimes this may happen from an injury, or simply from arthritis or boney problems. It may also be caused by other illnesses such as diabetes. If there is no improvement after treatment, further studies may be done to find the exact cause. DIAGNOSIS  X-rays may be needed if the problems become long standing. Electromyograms may be done. This study is one in which the working of nerves and muscles is studied. HOME CARE INSTRUCTIONS   Applications of ice packs may be helpful. Ice can be used in a plastic bag with a towel around it to prevent frostbite to skin. This may be used every 2 hours for 20 to 30 minutes, or as needed, while awake, or as directed by your caregiver.  Only take over-the-counter or prescription medicines for pain, discomfort, or fever as directed by your caregiver.  If physical therapy was prescribed, follow your caregiver's directions. SEEK IMMEDIATE MEDICAL CARE IF:   You have pain not controlled with medications.  You seem to be getting worse rather than better.  You develop increasing weakness in your legs.  You develop loss of bowel or bladder control.  You have difficulty with walking or balance, or develop clumsiness in the use of your legs.  You have a fever. MAKE SURE YOU:   Understand these instructions.  Will watch your condition.  Will get help right away if you are not doing well or get worse. Document Released: 10/18/2005 Document Revised: 01/10/2012 Document Reviewed: 06/07/2008 [facility] Patient Information 2015 [place], [place]. This information is not intended to replace advice given to you by your health  care provider. Make sure you discuss any questions you have with your health care provider.    Emergency Department Resource Guide 1) Find a Doctor and Pay Out of Pocket Although you won't have to find out who is covered by your insurance plan, it is a good idea to ask around and get recommendations. You will then need to call the office and see if the doctor you have chosen will accept you as a new patient and what types of options they offer for patients who are self-pay. Some doctors offer discounts or will set up payment plans for their patients who do not have insurance, but you will need to ask so you aren't surprised when you get to your appointment.  2) Contact Your Local Health Department Not all health departments have doctors that can see patients for sick visits, but many do, so it is worth a call to see if yours does. If you don't know where your local health department is, you can check in your phone book. The CDC also has a tool to help you locate your state's health department, and many state websites also have listings of all of their local health departments.  3) Find a [facility] If your illness is not likely to be very severe or complicated, you may want to try a walk in clinic. These are popping up all over the country in pharmacies, drugstores, and shopping centers. They're usually staffed by nurse practitioners or physician assistants that have been trained to treat common illnesses and complaints. They're usually fairly quick  and inexpensive. However, if you have serious medical issues or chronic medical problems, these are probably not your best option.  No Primary Care Doctor: - Call Health Connect at  [phone] - they can help you locate a primary care doctor that  accepts your insurance, provides certain services, etc. - Physician Referral Service- [phone]  Chronic Pain Problems: Organization         Address  Phone   Notes  [facility]   [phone] Patients need to be referred by their primary care doctor.   Medication Assistance: Organization         Address  Phone   Notes  [facility] Medication [facility] [address] [place], [place] [address] [phone] --Must be a resident of [facility] -- Must have NO insurance coverage whatsoever (no Medicaid/ Medicare, etc.) -- The pt. MUST have a primary care doctor that directs their care regularly and follows them in the community   MedAssist  [phone]   [employer]  [phone]    Agencies that provide inexpensive medical care: Organization         Address  Phone   Notes  [place]  [phone]   [facility]    [phone]   [facility] [place], [place] [address] [phone]   [place] [address], [place] [phone]   Planned Parenthood    [phone]   [facility]    [phone]   [place] and [place] [address], [place] Phone:  [phone], Fax:  [phone] Hours of Operation:  9 am - 6 pm, M-F.  Also accepts Medicaid/Medicare and self-pay.  [facility] for [place], [address], [place] Phone: [phone], Fax: [phone]. Hours of Operation:  8:30 am - 5:30 pm, M-F.  Also accepts Medicaid and self-pay.  [facility] [place] [address] [place] Phone: [phone]   [place], [place], [place] [phone], Ext. [phone] Mondays & Thursdays: 7-9 AM.  First 15 patients are seen on a first come, first serve basis.    [place] Providers:  Organization         Address  Phone   Notes  [facility] [address], [place] [phone] Also accepts self-pay patients.  [facility] [address], [place]  [phone]   [place], [address], [place] [phone]   [facility] [address] [place] [phone]   Lucianne Lei [address], [place]   [phone] Only accepts [place] Access [place] patients after they have their name applied to their card.   Self-Pay (no insurance) in [facility]:  Organization         Address  Phone   Notes  Sickle Cell Patients, [facility] [place] [phone]   [facility]gent [facility] [place] [phone]   [facility] [place]  [place], [place], [place] [phone]   [facility]/Dr. Osei-Bonsu  [address], [place] or [place], [address], [place] [phone] Phone number for  both [place] and [place] locations is the same.  Urgent Medical and [place] [address] [place] [phone]   [facility] [address] [place] or [address] [phone]   [facility] [address], [place] [phone], phone; [phone], fax Sees patients 1st and 3rd Saturday of every month.  Must not qualify for public or private insurance (i.e. Medicaid, Medicare, [place] Health Choice, Veterans' Benefits)  Household income should be no more than 200% of the poverty level The clinic cannot treat you if you are pregnant or think you are pregnant  Sexually transmitted diseases are not treated at the clinic.    Dental Care: Organization         Address  Phone  Notes  [facility] of [facility] [place] [phone] Accepts children up to age 23 who are enrolled in [place] or [place]; pregnant women with a Medicaid card; and children who have applied for Medicaid or [place] Health  Choice, but were declined, whose parents can pay a reduced fee at time of service.  [facility] of [facility]  [address], [place] [phone] Accepts children up to age 54 who are enrolled in [place] or [place]; pregnant women with a Medicaid card; and children who have applied for Medicaid or [place] Health Choice, but were declined, whose parents can pay a reduced fee at time of service.  [place] Adult Dental Access PROGRAM  [place] [phone] Patients are seen by appointment only. Walk-ins are not accepted. [place] will see patients 14 years of age and older. Monday - Tuesday (8am-5pm) Most Wednesdays (8:30-5pm) $30 per visit, cash only  [facility] Adult Dental Access PROGRAM  [address], [facility] [phone] Patients are seen by appointment only. Walk-ins are not accepted. [place] will see patients 54 years of age and older. One Wednesday Evening (Monthly: Volunteer Based).  $30 per visit, cash only  [place]  [phone] for adults; Children under age 43, call Graduate Pediatric Dentistry at [phone]. Children aged 2-14, please call [phone] to request a pediatric application.  Dental services are provided in all areas of dental care including fillings, crowns and bridges, complete and partial dentures, implants, gum treatment, root canals, and extractions. Preventive care is also provided. Treatment is provided to both adults and children. Patients are selected via a lottery and there is often a waiting list.   [facility] [address] [place]  [phone] [email or web address]   [facility] [address] [place], [place] [phone], Ext. [phone] Second and Fourth Thursday of each month, opens at 6:30 AM; Clinic ends at 9 AM.  Patients are seen on a first-come first-served basis, and a limited number are seen during each  clinic.   [facility]  [address] [place]ard [place]er [place], [place] [phone]   Eligibility Requirements You must have lived in [place], [place], or [place] counties for at least the last three months.   You cannot be eligible for state or federal sponsored [employer], including [employer], [place], or [employer].   You generally cannot be eligible for healthcare insurance through your employer.    How to apply: Eligibility screenings are held every Tuesday and Wednesday afternoon from 1:00 pm until 4:00  pm. You do not need an appointment for the interview!  [facility] [address], [place], [place]   Gateway  [place] Department  [place]  [phone]    Behavioral Health Resources in the Community: Intensive Outpatient Programs Organization         Address  Phone  Notes  [place]. [address] [place], [place] [phone]   [facility] [address] [place], [place]   ADS: Alcohol & Drug S[facility]cs [address], [place], [place]   [place] [address],  [place], [place] or [phone]   Substance Abuse Resources Organization         Address  Phone  Notes  Alcohol and Drug Services  [phone]   [place]  [phone]   The [place]   Chinita Pester  ([phone]   Residential & Outpatient Substance Abuse Program  [phone]   Psychological Services Organization         Address  Phone  Notes  [facility] [place]  [place]  [phone]   [place] [address], [place] or [phone]    Mobile Crisis Teams Organization         Address  Phone  Notes  Therapeutic Alternatives, Mobile  Crisis Care Unit  [phone]   [facility]  [address] [place], [place]   [place] [address] [place] [phone]    Self-Help/Support Groups Organization         Address  Phone             Notes  [place]. of [place] - variety of support groups  [place] Call for more information  Narcotics Anonymous (NA), [facility] [address], [employer] [place]  2 meetings at this location   Special educational needs teacher         Address  Phone  Notes  [facility] Residential Treatment [place],    [place]  [phone]   [facility]  [address], [place] [address], [place], [place]   [place], [place] [phone] Admissions: 8am-3pm M-F  Incentives Substance [place] [address]    [place], [place] [phone]   [facility] [address] [place], [employer], [place]   [facility] [address]  [place], [place]   [facility]ight Programs - Intensive Outpatient [address]een [address], [place], [place]   [facility] ([place].,  [place], [place] [phone]-([phone] or [phone]   [facility]idential Treatment Services (RTS) [address] [place], [place] Accepts Medicaid  [facility] [place] [address],  [place] [phone])[phone] Substance Abuse/Addiction Treatment   [facility] Organization         Address  Phone  Notes  [facility]  [phone]   Domenic Schwab, PhD [address] [place]lis [place], [place]   [phone] or [phone]   [place], [place] [phone]   [place] [address] [place], [place] [phone] Insurance/Medicaid/sponsorship through [employer] and Families [address], [place]  [address]  [place], [place] [phone]) [phone] Therapy/tele-psych/case  [facility] [address]   [place], [place] [phone]) [phone]    Dr. Lolly MustacheArfeen  [phone]) [phone]   [facility]  [employer] [facility] 1) [address], [place] 2) [address], [place] 3)  [address], [place] [phone](336) [phone](336) [phone]  [phone](336) [phone]   [facility] Child Abuse Hotline ([phone])[phone](336) 613-110-4032 or [phone](336) [phone] (After Hours)            Use the the other medicines as directed.  Do not drive within 4 hours of taking hydrocodone as this will make you drowsy.  Avoid lifting,  Bending,  Twisting or any other activity that worsens your pain over the next week.  Apply an  icepack  to your lower back for 10-15 minutes every 2 hours for the next 2 days.  You should get rechecked if your symptoms are not better over the next 5 days,  Or you develop increased pain,  Weakness in your leg(s) or loss of bladder or bowel function - these are symptoms of a worse injury.

## 2014-05-25 NOTE — ED Notes (Signed)
Patient with no complaints at this time. Respirations even and unlabored. Skin warm/dry. Discharge instructions reviewed with patient at this time. Patient given opportunity to voice concerns/ask questions. Patient discharged at this time and left Emergency Department with steady gait.   

## 2014-06-10 ENCOUNTER — Ambulatory Visit (HOSPITAL_COMMUNITY): Payer: Self-pay | Admitting: Psychiatry

## 2014-06-17 ENCOUNTER — Ambulatory Visit (INDEPENDENT_AMBULATORY_CARE_PROVIDER_SITE_OTHER): Payer: 59 | Admitting: Psychiatry

## 2014-06-17 ENCOUNTER — Encounter (HOSPITAL_COMMUNITY): Payer: Self-pay | Admitting: Psychiatry

## 2014-06-17 VITALS — BP 144/77 | HR 75 | Ht 74.0 in | Wt 206.4 lb

## 2014-06-17 DIAGNOSIS — F4310 Post-traumatic stress disorder, unspecified: Secondary | ICD-10-CM

## 2014-06-17 DIAGNOSIS — F902 Attention-deficit hyperactivity disorder, combined type: Secondary | ICD-10-CM

## 2014-06-17 DIAGNOSIS — F39 Unspecified mood [affective] disorder: Secondary | ICD-10-CM

## 2014-06-17 DIAGNOSIS — F909 Attention-deficit hyperactivity disorder, unspecified type: Secondary | ICD-10-CM

## 2014-06-17 MED ORDER — GABAPENTIN 400 MG PO CAPS: 400.0000 mg | ORAL_CAPSULE | Freq: Three times a day (TID) | ORAL | Status: DC

## 2014-06-17 MED ORDER — TRAZODONE HCL 100 MG PO TABS: 100.0000 mg | ORAL_TABLET | Freq: Every day | ORAL | Status: DC

## 2014-06-17 MED ORDER — LAMOTRIGINE 25 MG PO TABS: 25.0000 mg | ORAL_TABLET | Freq: Two times a day (BID) | ORAL | Status: DC

## 2014-06-17 MED ORDER — METHYLPHENIDATE HCL ER (OSM) 36 MG PO TBCR: 36.0000 mg | EXTENDED_RELEASE_TABLET | Freq: Every day | ORAL | Status: DC
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
## 2014-07-01 ENCOUNTER — Encounter (HOSPITAL_COMMUNITY): Payer: Self-pay | Admitting: Emergency Medicine

## 2014-07-01 ENCOUNTER — Emergency Department (HOSPITAL_COMMUNITY)
Admission: EM | Admit: 2014-07-01 | Discharge: 2014-07-01 | Disposition: A | Discharge status: Home / Self Care | Payer: Medicaid Other | Attending: Emergency Medicine | Admitting: Emergency Medicine

## 2014-07-01 DIAGNOSIS — F319 Bipolar disorder, unspecified: Secondary | ICD-10-CM | POA: Diagnosis not present

## 2014-07-01 DIAGNOSIS — S39012A Strain of muscle, fascia and tendon of lower back, initial encounter: Secondary | ICD-10-CM

## 2014-07-01 DIAGNOSIS — M129 Arthropathy, unspecified: Secondary | ICD-10-CM | POA: Insufficient documentation

## 2014-07-01 DIAGNOSIS — IMO0002 Reserved for concepts with insufficient information to code with codable children: Secondary | ICD-10-CM | POA: Diagnosis present

## 2014-07-01 DIAGNOSIS — Z791 Long term (current) use of non-steroidal anti-inflammatories (NSAID): Secondary | ICD-10-CM | POA: Insufficient documentation

## 2014-07-01 DIAGNOSIS — J4489 Other specified chronic obstructive pulmonary disease: Secondary | ICD-10-CM | POA: Insufficient documentation

## 2014-07-01 DIAGNOSIS — X503XXA Overexertion from repetitive movements, initial encounter: Secondary | ICD-10-CM | POA: Diagnosis not present

## 2014-07-01 DIAGNOSIS — S335XXA Sprain of ligaments of lumbar spine, initial encounter: Secondary | ICD-10-CM | POA: Insufficient documentation

## 2014-07-01 DIAGNOSIS — F17200 Nicotine dependence, unspecified, uncomplicated: Secondary | ICD-10-CM | POA: Insufficient documentation

## 2014-07-01 DIAGNOSIS — X500XXA Overexertion from strenuous movement or load, initial encounter: Secondary | ICD-10-CM | POA: Diagnosis not present

## 2014-07-01 DIAGNOSIS — Z79899 Other long term (current) drug therapy: Secondary | ICD-10-CM | POA: Diagnosis not present

## 2014-07-01 DIAGNOSIS — Y929 Unspecified place or not applicable: Secondary | ICD-10-CM | POA: Insufficient documentation

## 2014-07-01 DIAGNOSIS — J449 Chronic obstructive pulmonary disease, unspecified: Secondary | ICD-10-CM | POA: Diagnosis not present

## 2014-07-01 DIAGNOSIS — Y9389 Activity, other specified: Secondary | ICD-10-CM | POA: Insufficient documentation

## 2014-07-01 DIAGNOSIS — F988 Other specified behavioral and emotional disorders with onset usually occurring in childhood and adolescence: Secondary | ICD-10-CM | POA: Diagnosis not present

## 2014-07-01 NOTE — ED Provider Notes (Signed)
CSN: [id number]     Arrival date & time 07/01/14  1247 History   First MD Initiated Contact with Patient 07/01/14 1402     Chief Complaint  Patient presents with  . Back Pain     (Consider location/radiation/quality/duration/timing/severity/associated sxs/prior Treatment) Patient is a 43 y.o. male presenting with back pain. The history is provided by the patient.  Back Pain Location:  Lumbar spine Quality:  Shooting and aching Radiates to:  Does not radiate Pain severity:  Moderate Pain is:  Same all the time Onset quality:  Sudden Duration:  1 day Timing:  Intermittent Progression:  Worsening Chronicity:  New Context: lifting heavy objects   Context comment:  Lifting heavy furniture Relieved by:  Nothing Worsened by:  Movement Ineffective treatments:  Lying down Associated symptoms: no abdominal pain, no bladder incontinence, no bowel incontinence, no chest pain, no dysuria and no perianal numbness     Past Medical History  Diagnosis Date  . COPD (chronic obstructive pulmonary disease)   . Arthritis   . Seizures     only once 12/30/2012  . Depression   . Bipolar disorder   . ADD (attention deficit disorder)    Past Surgical History  Procedure Laterality Date  . Mandible fracture surgery    . Appendectomy     History reviewed. No pertinent family history. History  Substance Use Topics  . Smoking status: Current Every Day Smoker -- 1.00 packs/day    Types: Cigarettes  . Smokeless tobacco: Never Used  . Alcohol Use: No    Review of Systems  Constitutional: Negative for activity change.       All ROS Neg except as noted in HPI  HENT: Negative for nosebleeds.   Eyes: Negative for photophobia and discharge.  Respiratory: Negative for cough, shortness of breath and wheezing.   Cardiovascular: Negative for chest pain and palpitations.  Gastrointestinal: Negative for abdominal pain, blood in stool and bowel incontinence.  Genitourinary: Negative for bladder  incontinence, dysuria, frequency and hematuria.  Musculoskeletal: Positive for arthralgias and back pain. Negative for neck pain.  Skin: Negative.   Neurological: Positive for seizures. Negative for dizziness and speech difficulty.  Psychiatric/Behavioral: Negative for hallucinations and confusion.       Depression      Allergies  Aspirin; Abilify; Benadryl; Ketorolac; Morphine and related; and Tramadol  Home Medications   Prior to Admission medications   Medication Sig Start Date End Date Taking? Authorizing Provider  b complex vitamins tablet Take 1 tablet by mouth daily.   Yes Historical Provider, MD  CALCIUM-MAGNESIUM-ZINC PO Take 1 tablet by mouth daily.   Yes Historical Provider, MD  cyclobenzaprine (FLEXERIL) 5 MG tablet Take 1 tablet (5 mg total) by mouth 3 (three) times daily as needed for muscle spasms. 05/25/14  Yes Raynelle Fanning Idol, PA-C  gabapentin (NEURONTIN) 400 MG capsule Take 1 capsule (400 mg total) by mouth 3 (three) times daily. 06/17/14  Yes Meghan Blankmann, NP  ibuprofen (ADVIL,MOTRIN) 200 MG tablet Take 800 mg by mouth daily as needed for moderate pain.   Yes Historical Provider, MD  methylphenidate (CONCERTA) 36 MG PO CR tablet Take 1 tablet (36 mg total) by mouth daily. 06/17/14 06/17/15 Yes Meghan Blankmann, NP  naproxen sodium (ANAPROX) 220 MG tablet Take 440 mg by mouth daily as needed (pain).   Yes Historical Provider, MD  Omega-3 Fatty Acids (FISH OIL PO) Take 1 capsule by mouth daily.   Yes Historical Provider, MD  traZODone (DESYREL) 100 MG tablet Take  1 tablet (100 mg total) by mouth at bedtime. 06/17/14  Yes Meghan Blankmann, NP   BP 117/60  Pulse 62  Temp(Src) 97.9 F (36.6 C) (Oral)  Resp 18  Ht  (1.88 m)  Wt 215 lb (97.523 kg)  BMI 27.59 kg/m2  SpO2 100% Physical Exam  Nursing note and vitals reviewed. Constitutional: He is oriented to person, place, and time. He appears well-developed and well-nourished.  Non-toxic appearance.  HENT:  Head:  Normocephalic.  Right Ear: Tympanic membrane and external ear normal.  Left Ear: Tympanic membrane and external ear normal.  Eyes: EOM and lids are normal. Pupils are equal, round, and reactive to light.  Neck: Normal range of motion. Neck supple. Carotid bruit is not present.  Cardiovascular: Normal rate, regular rhythm, normal heart sounds, intact distal pulses and normal pulses.   Pulmonary/Chest: Breath sounds normal. No respiratory distress.  Abdominal: Soft. Bowel sounds are normal. There is no tenderness. There is no guarding.  Musculoskeletal:       Lumbar back: He exhibits decreased range of motion, tenderness and spasm.  Lymphadenopathy:       Head (right side): No submandibular adenopathy present.       Head (left side): No submandibular adenopathy present.    He has no cervical adenopathy.  Neurological: He is alert and oriented to person, place, and time. He has normal strength. No cranial nerve deficit or sensory deficit. He exhibits normal muscle tone.  Skin: Skin is warm and dry.  Psychiatric: He has a normal mood and affect. His speech is normal.    ED Course  Procedures (including critical care time) Labs Review Labs Reviewed - No data to display  Imaging Review No results found.   EKG Interpretation None      MDM  Exam is consistent with lumbar strain following lifting heavy furniture. No gross neuro deficit noted.. Pt to apply heat. Pt has flexeril, pt encouraged to take the medication as prescribed. He will rest his back and f/w with his PCP this week.   Final diagnoses:  Lumbar strain, initial encounter    *I have reviewed nursing notes, vital signs, and all appropriate lab and imaging results for this patient.Kathie Dike, PA-C 07/02/14 2025

## 2014-07-01 NOTE — ED Notes (Signed)
Low back pain after moving furniture yesterday.

## 2014-07-01 NOTE — Discharge Instructions (Signed)
Your examination is consistent with lumbar strain, aggravated by degenerative disc disease. No acute or surgical findings on today's exam. Please apply heat to your back. Please continue your Flexeril. Please rest your back is much as possible. Lumbosacral Strain Lumbosacral strain is a strain of any of the parts that make up your lumbosacral vertebrae. Your lumbosacral vertebrae are the bones that make up the lower third of your backbone. Your lumbosacral vertebrae are held together by muscles and tough, fibrous tissue (ligaments).  CAUSES  A sudden blow to your back can cause lumbosacral strain. Also, anything that causes an excessive stretch of the muscles in the low back can cause this strain. This is typically seen when people exert themselves strenuously, fall, lift heavy objects, bend, or crouch repeatedly. RISK FACTORS  Physically demanding work.  Participation in pushing or pulling sports or sports that require a sudden twist of the back (tennis, golf, baseball).  Weight lifting.  Excessive lower back curvature.  Forward-tilted pelvis.  Weak back or abdominal muscles or both.  Tight hamstrings. SIGNS AND SYMPTOMS  Lumbosacral strain may cause pain in the area of your injury or pain that moves (radiates) down your leg.  DIAGNOSIS Your health care provider can often diagnose lumbosacral strain through a physical exam. In some cases, you may need tests such as X-ray exams.  TREATMENT  Treatment for your lower back injury depends on many factors that your clinician will have to evaluate. However, most treatment will include the use of anti-inflammatory medicines. HOME CARE INSTRUCTIONS   Avoid hard physical activities (tennis, racquetball, waterskiing) if you are not in proper physical condition for it. This may aggravate or create problems.  If you have a back problem, avoid sports requiring sudden body movements. Swimming and walking are generally safer activities.  Maintain  good posture.  Maintain a healthy weight.  For acute conditions, you may put ice on the injured area.  Put ice in a plastic bag.  Place a towel between your skin and the bag.  Leave the ice on for 20 minutes, 2-3 times a day.  When the low back starts healing, stretching and strengthening exercises may be recommended. SEEK MEDICAL CARE IF:  Your back pain is getting worse.  You experience severe back pain not relieved with medicines. SEEK IMMEDIATE MEDICAL CARE IF:   You have numbness, tingling, weakness, or problems with the use of your arms or legs.  There is a change in bowel or bladder control.  You have increasing pain in any area of the body, including your belly (abdomen).  You notice shortness of breath, dizziness, or feel faint.  You feel sick to your stomach (nauseous), are throwing up (vomiting), or become sweaty.  You notice discoloration of your toes or legs, or your feet get very cold. MAKE SURE YOU:   Understand these instructions.  Will watch your condition.  Will get help right away if you are not doing well or get worse. Document Released: 07/28/2005 Document Revised: 10/23/2013 Document Reviewed: 06/06/2013 [facility] Patient Information 2015 [place], [place]. This information is not intended to replace advice given to you by your health care provider. Make sure you discuss any questions you have with your health care provider.

## 2014-07-02 ENCOUNTER — Ambulatory Visit (HOSPITAL_COMMUNITY): Payer: Self-pay | Admitting: Licensed Clinical Social Worker

## 2014-07-02 NOTE — ED Provider Notes (Signed)
Medical screening examination/treatment/procedure(s) were performed by non-physician practitioner and as supervising physician I was immediately available for consultation/collaboration.   EKG Interpretation None       Mohsen Odenthal M Preslyn Warr, MD 07/02/14 2106 

## 2014-07-08 ENCOUNTER — Encounter (HOSPITAL_COMMUNITY): Payer: Self-pay | Admitting: Emergency Medicine

## 2014-07-08 ENCOUNTER — Emergency Department (HOSPITAL_COMMUNITY)
Admission: EM | Admit: 2014-07-08 | Discharge: 2014-07-09 | Disposition: A | Discharge status: Home / Self Care | Payer: Medicaid Other | Attending: Emergency Medicine | Admitting: Emergency Medicine

## 2014-07-08 DIAGNOSIS — X503XXA Overexertion from repetitive movements, initial encounter: Secondary | ICD-10-CM | POA: Diagnosis not present

## 2014-07-08 DIAGNOSIS — J449 Chronic obstructive pulmonary disease, unspecified: Secondary | ICD-10-CM | POA: Insufficient documentation

## 2014-07-08 DIAGNOSIS — G40909 Epilepsy, unspecified, not intractable, without status epilepticus: Secondary | ICD-10-CM | POA: Diagnosis not present

## 2014-07-08 DIAGNOSIS — M62830 Muscle spasm of back: Secondary | ICD-10-CM

## 2014-07-08 DIAGNOSIS — IMO0002 Reserved for concepts with insufficient information to code with codable children: Secondary | ICD-10-CM | POA: Diagnosis present

## 2014-07-08 DIAGNOSIS — M5380 Other specified dorsopathies, site unspecified: Secondary | ICD-10-CM | POA: Insufficient documentation

## 2014-07-08 DIAGNOSIS — Y9389 Activity, other specified: Secondary | ICD-10-CM | POA: Diagnosis not present

## 2014-07-08 DIAGNOSIS — J4489 Other specified chronic obstructive pulmonary disease: Secondary | ICD-10-CM | POA: Insufficient documentation

## 2014-07-08 DIAGNOSIS — M545 Low back pain, unspecified: Secondary | ICD-10-CM

## 2014-07-08 DIAGNOSIS — F909 Attention-deficit hyperactivity disorder, unspecified type: Secondary | ICD-10-CM | POA: Diagnosis not present

## 2014-07-08 DIAGNOSIS — F17200 Nicotine dependence, unspecified, uncomplicated: Secondary | ICD-10-CM | POA: Insufficient documentation

## 2014-07-08 DIAGNOSIS — F319 Bipolar disorder, unspecified: Secondary | ICD-10-CM | POA: Insufficient documentation

## 2014-07-08 DIAGNOSIS — Z79899 Other long term (current) drug therapy: Secondary | ICD-10-CM | POA: Diagnosis not present

## 2014-07-08 DIAGNOSIS — Y9289 Other specified places as the place of occurrence of the external cause: Secondary | ICD-10-CM | POA: Diagnosis not present

## 2014-07-08 DIAGNOSIS — M129 Arthropathy, unspecified: Secondary | ICD-10-CM | POA: Diagnosis not present

## 2014-07-08 MED ORDER — HYDROCODONE-ACETAMINOPHEN 5-325 MG PO TABS
2.0000 | ORAL_TABLET | Freq: Once | ORAL | Status: AC
Start: 2014-07-08 — End: 2014-07-08
  Administered 2014-07-08: 2 via ORAL
  Filled 2014-07-08: qty 2

## 2014-07-08 NOTE — ED Notes (Signed)
Patient presents stating he was lifting furniture on Wednesday night and Thursday he started with lower back pain that radiates to his right out thigh and goes to his knee

## 2014-07-09 MED ORDER — NAPROXEN 500 MG PO TABS: 500.0000 mg | ORAL_TABLET | Freq: Two times a day (BID) | ORAL | Status: DC | PRN

## 2014-07-09 MED ORDER — HYDROCODONE-ACETAMINOPHEN 5-325 MG PO TABS: 1.0000 | ORAL_TABLET | Freq: Four times a day (QID) | ORAL | Status: DC | PRN

## 2014-07-09 MED ORDER — CYCLOBENZAPRINE HCL 10 MG PO TABS: 10.0000 mg | ORAL_TABLET | Freq: Three times a day (TID) | ORAL | Status: DC | PRN

## 2014-07-09 NOTE — Discharge Instructions (Signed)
Back Pain:  Your back pain should be treated with medicines such as naprosyn or aleve and this back pain should get better over the next 2 weeks.  However if you develop severe or worsening pain, low back pain with fever, numbness, weakness or inability to walk or urinate, you should return to the ER immediately.  Please follow up with your doctor this week for a recheck if still having symptoms.  Low back pain is discomfort in the lower back that may be due to injuries to muscles and ligaments around the spine.  Occasionally, it may be caused by a a problem to a part of the spine called a disc.  The pain may last several days or a week;  However, most patients get completely well in 4 weeks.  Self - care:  The application of heat can help soothe the pain.  Maintaining your daily activities, including walking, is encourged, as it will help you get better faster than just staying in bed. Perform gentle stretching as discussed. Drink plenty of fluids.  Medications are also useful to help with pain control.  A commonly prescribed medications includes vicodin. Don't drive while taking this medication.  Non steroidal anti inflammatory medications including Ibuprofen and naproxen;  These medications help both pain and swelling and are very useful in treating back pain.  They should be taken with food, as they can cause stomach upset, and more seriously, stomach bleeding.    Muscle relaxants (flexeril):  These medications can help with muscle tightness that is a cause of lower back pain.  Most of these medications can cause drowsiness, and it is not safe to drive or use dangerous machinery while taking them.  SEEK IMMEDIATE MEDICAL ATTENTION IF: New numbness, tingling, weakness, or problem with the use of your arms or legs.  Severe back pain not relieved with medications.  Difficulty with or loss of control of your bowel or bladder control.  Increasing pain in any areas of the body (such as chest or  abdominal pain).  Shortness of breath, dizziness or fainting.  Nausea (feeling sick to your stomach), vomiting, fever, or sweats.  You will need to follow up with  Your primary healthcare provider in 1-2 weeks for reassessment.   Back Pain, Adult Back pain is very common. The pain often gets better over time. The cause of back pain is usually not dangerous. Most people can learn to manage their back pain on their own.  HOME CARE   Stay active. Start with short walks on flat ground if you can. Try to walk farther each day.  Do not sit, drive, or stand in one place for more than 30 minutes. Do not stay in bed.  Do not avoid exercise or work. Activity can help your back heal faster.  Be careful when you bend or lift an object. Bend at your knees, keep the object close to you, and do not twist.  Sleep on a firm mattress. Lie on your side, and bend your knees. If you lie on your back, put a pillow under your knees.  Only take medicines as told by your doctor.  Put ice on the injured area.  Put ice in a plastic bag.  Place a towel between your skin and the bag.  Leave the ice on for 15-20 minutes, 03-04 times a day for the first 2 to 3 days. After that, you can switch between ice and heat packs.  Ask your doctor about back exercises or massage.  Avoid feeling anxious or stressed. Find good ways to deal with stress, such as exercise. GET HELP RIGHT AWAY IF:   Your pain does not go away with rest or medicine.  Your pain does not go away in 1 week.  You have new problems.  You do not feel well.  The pain spreads into your legs.  You cannot control when you poop (bowel movement) or pee (urinate).  Your arms or legs feel weak or lose feeling (numbness).  You feel sick to your stomach (nauseous) or throw up (vomit).  You have belly (abdominal) pain.  You feel like you may pass out (faint). MAKE SURE YOU:   Understand these instructions.  Will watch your  condition.  Will get help right away if you are not doing well or get worse. Document Released: 04/05/2008 Document Revised: 01/10/2012 Document Reviewed: 02/19/2014 [facility] Patient Information 2015 [place], [place]. This information is not intended to replace advice given to you by your health care provider. Make sure you discuss any questions you have with your health care provider.  Back Exercises Back exercises help treat and prevent back injuries. The goal of back exercises is to increase the strength of your abdominal and back muscles and the flexibility of your back. These exercises should be started when you no longer have back pain. Back exercises include:  Pelvic Tilt. Lie on your back with your knees bent. Tilt your pelvis until the lower part of your back is against the floor. Hold this position 5 to 10 sec and repeat 5 to 10 times.  Knee to Chest. Pull first 1 knee up against your chest and hold for 20 to 30 seconds, repeat this with the other knee, and then both knees. This may be done with the other leg straight or bent, whichever feels better.  Sit-Ups or Curl-Ups. Bend your knees 90 degrees. Start with tilting your pelvis, and do a partial, slow sit-up, lifting your trunk only 30 to 45 degrees off the floor. Take at least 2 to 3 seconds for each sit-up. Do not do sit-ups with your knees out straight. If partial sit-ups are difficult, simply do the above but with only tightening your abdominal muscles and holding it as directed.  Hip-Lift. Lie on your back with your knees flexed 90 degrees. Push down with your feet and shoulders as you raise your hips a couple inches off the floor; hold for 10 seconds, repeat 5 to 10 times.  Back arches. Lie on your stomach, propping yourself up on bent elbows. Slowly press on your hands, causing an arch in your low back. Repeat 3 to 5 times. Any initial stiffness and discomfort should lessen with repetition over time.  Shoulder-Lifts. Lie face  down with arms beside your body. Keep hips and torso pressed to floor as you slowly lift your head and shoulders off the floor. Do not overdo your exercises, especially in the beginning. Exercises may cause you some mild back discomfort which lasts for a few minutes; however, if the pain is more severe, or lasts for more than 15 minutes, do not continue exercises until you see your caregiver. Improvement with exercise therapy for back problems is slow.  See your caregivers for assistance with developing a proper back exercise program. Document Released: 11/25/2004 Document Revised: 01/10/2012 Document Reviewed: 08/19/2011 [facility] Patient Information 2015 [place], [place]. This information is not intended to replace advice given to you by your health care provider. Make sure you discuss any questions you have with  your health care provider.  Heat Therapy Heat therapy can help make painful, stiff muscles and joints feel better. Do not use heat on new injuries. Wait at least 48 hours after an injury to use heat. Do not use heat when you have aches or pains right after an activity. If you still have pain 3 hours after stopping the activity, then you may use heat. HOME CARE Wet heat pack  Soak a clean towel in warm water. Squeeze out the extra water.  Put the warm, wet towel in a plastic bag.  Place a thin, dry towel between your skin and the bag.  Put the heat pack on the area for 5 minutes, and check your skin. Your skin may be pink, but it should not be red.  Leave the heat pack on the area for 15 to 30 minutes.  Repeat this every 2 to 4 hours while awake. Do not use heat while you are sleeping. Warm water bath  Fill a tub with warm water.  Place the affected body part in the tub.  Soak the area for 20 to 40 minutes.  Repeat as needed. Hot water bottle  Fill the water bottle half full with hot water.  Press out the extra air. Close the cap tightly.  Place a dry towel between your  skin and the bottle.  Put the bottle on the area for 5 minutes, and check your skin. Your skin may be pink, but it should not be red.  Leave the bottle on the area for 15 to 30 minutes.  Repeat this every 2 to 4 hours while awake. Electric heating pad  Place a dry towel between your skin and the heating pad.  Set the heating pad on low heat.  Put the heating pad on the area for 10 minutes, and check your skin. Your skin may be pink, but it should not be red.  Leave the heating pad on the area for 20 to 40 minutes.  Repeat this every 2 to 4 hours while awake.  Do not lie on the heating pad.  Do not fall asleep while using the heating pad.  Do not use the heating pad near water. GET HELP RIGHT AWAY IF:  You get blisters or red skin.  Your skin is puffy (swollen), or you lose feeling (numbness) in the affected area.  You have any new problems.  Your problems are getting worse.  You have any questions or concerns. If you have any problems, stop using heat therapy until you see your doctor. MAKE SURE YOU:  Understand these instructions.  Will watch your condition.  Will get help right away if you are not doing well or get worse. Document Released: 01/10/2012 Document Reviewed: 12/11/2013 [facility] Patient Information 2015 [place], [place]. This information is not intended to replace advice given to you by your health care provider. Make sure you discuss any questions you have with your health care provider.

## 2014-07-09 NOTE — ED Provider Notes (Signed)
CSN: [id number]     Arrival date & time 07/08/14  2202 History   First MD Initiated Contact with Patient 07/09/14 0005     Chief Complaint  Patient presents with  . Back Pain  . Leg Pain     (Consider location/radiation/quality/duration/timing/severity/associated sxs/prior Treatment) HPI Comments: [name] is a 43 y.o. Male with a PMHx of arthritis, DJD, depression, and bipolar disorder, who presents to the ED with complaints of low back pain since there is day after lifting heavy furniture. He rates the pain as 7/10, aching and stabbing, constant, radiating to bilateral anterior thighs, worsened with sitting and walking, improved with heat, and unrelieved after Percocet. He states he has injured his back previously, but that he has no ongoing care for this back pain. He endorses intermittent tingling to bilateral lower extremities, but denies any numbness or weakness. Also endorses that after being seated for a while, his back gets stiff again but gets better once he's moved around for a bit. Denies any fevers, chills, abdominal pain, nausea, vomiting, dysuria, hematuria, bowel or bladder incontinence, inguinal pain, testicular pain or swelling, or history of cancer.  Patient is a 43 y.o. male presenting with back pain. The history is provided by the patient. No language interpreter was used.  Back Pain Location:  Lumbar spine Quality:  Stiffness and stabbing Stiffness is present: after being seated for long periods of time. Radiates to:  L thigh and R thigh Pain severity:  Moderate (7/10) Pain is:  Same all the time Onset quality:  Sudden Duration:  4 days Timing:  Constant Progression:  Unchanged Chronicity:  Recurrent Context: lifting heavy objects   Relieved by:  Heating pad Worsened by:  Ambulation and bending Ineffective treatments:  Narcotics Associated symptoms: leg pain (radiating from back, anterior thighs bilaterally) and tingling (intermittent)   Associated symptoms:  no abdominal pain, no bladder incontinence, no bowel incontinence, no chest pain, no dysuria, no fever, no headaches, no numbness, no paresthesias, no pelvic pain, no perianal numbness, no weakness and no weight loss   Risk factors: no hx of cancer     Past Medical History  Diagnosis Date  . COPD (chronic obstructive pulmonary disease)   . Arthritis   . Seizures     only once 12/30/2012  . Depression   . Bipolar disorder   . ADD (attention deficit disorder)    Past Surgical History  Procedure Laterality Date  . Mandible fracture surgery    . Appendectomy     History reviewed. No pertinent family history. History  Substance Use Topics  . Smoking status: Current Every Day Smoker -- 1.00 packs/day    Types: Cigarettes  . Smokeless tobacco: Never Used  . Alcohol Use: No    Review of Systems  Constitutional: Negative for fever, chills and weight loss.  Respiratory: Negative for shortness of breath.   Cardiovascular: Negative for chest pain.  Gastrointestinal: Negative for nausea, vomiting, abdominal pain, diarrhea, constipation and bowel incontinence.  Genitourinary: Negative for bladder incontinence, dysuria, hematuria, flank pain, penile swelling, scrotal swelling, difficulty urinating, penile pain, testicular pain and pelvic pain.  Musculoskeletal: Positive for back pain. Negative for arthralgias, joint swelling, myalgias, neck pain and neck stiffness.  Skin: Negative for color change.  Neurological: Positive for tingling (intermittent). Negative for weakness, numbness, headaches and paresthesias.  10 Systems reviewed and are negative for acute change except as noted in the HPI.     Allergies  Aspirin; Abilify; Benadryl; Ketorolac; Morphine and related;  and Tramadol  Home Medications   Prior to Admission medications   Medication Sig Start Date End Date Taking? Authorizing Provider  b complex vitamins tablet Take 1 tablet by mouth daily.    Historical Provider, MD    CALCIUM-MAGNESIUM-ZINC PO Take 1 tablet by mouth daily.    Historical Provider, MD  cyclobenzaprine (FLEXERIL) 10 MG tablet Take 1 tablet (10 mg total) by mouth 3 (three) times daily as needed for muscle spasms. 07/09/14   Sama Arauz Strupp Camprubi-Soms, PA-C  cyclobenzaprine (FLEXERIL) 5 MG tablet Take 1 tablet (5 mg total) by mouth 3 (three) times daily as needed for muscle spasms. 05/25/14   Burgess Amor, PA-C  gabapentin (NEURONTIN) 400 MG capsule Take 1 capsule (400 mg total) by mouth 3 (three) times daily. 06/17/14   Kendrick Fries, NP  HYDROcodone-acetaminophen (NORCO) 5-325 MG per tablet Take 1-2 tablets by mouth every 6 (six) hours as needed for severe pain. 07/09/14   Waverly Tarquinio Strupp Camprubi-Soms, PA-C  ibuprofen (ADVIL,MOTRIN) 200 MG tablet Take 800 mg by mouth daily as needed for moderate pain.    Historical Provider, MD  methylphenidate (CONCERTA) 36 MG PO CR tablet Take 1 tablet (36 mg total) by mouth daily. 06/17/14 06/17/15  Kendrick Fries, NP  naproxen (NAPROSYN) 500 MG tablet Take 1 tablet (500 mg total) by mouth 2 (two) times daily as needed for mild pain, moderate pain or headache (TAKE WITH MEALS.). 07/09/14   Donnita Falls Camprubi-Soms, PA-C  naproxen sodium (ANAPROX) 220 MG tablet Take 440 mg by mouth daily as needed (pain).    Historical Provider, MD  Omega-3 Fatty Acids (FISH OIL PO) Take 1 capsule by mouth daily.    Historical Provider, MD  traZODone (DESYREL) 100 MG tablet Take 1 tablet (100 mg total) by mouth at bedtime. 06/17/14   Meghan Blankmann, NP   BP 128/72  Pulse 70  Temp(Src) 98.1 F (36.7 C) (Oral)  Resp 18  Ht  (1.88 m)  Wt 225 lb (102.059 kg)  BMI 28.88 kg/m2  SpO2 99% Physical Exam  Nursing note and vitals reviewed. Constitutional: He is oriented to person, place, and time. Vital signs are normal. He appears well-developed and well-nourished. No distress.  Afebrile, nontoxic, NAD  HENT:  Head: Normocephalic and atraumatic.  Mouth/Throat: Mucous  membranes are normal.  Eyes: Conjunctivae and EOM are normal. Right eye exhibits no discharge. Left eye exhibits no discharge.  Neck: Normal range of motion. Neck supple. No spinous process tenderness and no muscular tenderness present. No rigidity. Normal range of motion present.  FROM intact without spinous process or paraspinous muscle TTP, no bony stepoffs or deformities, no muscle spasms. No rigidity or meningeal signs. No bruising or swelling.  Cardiovascular: Normal rate and intact distal pulses.   Distal pulses intact  Pulmonary/Chest: Effort normal. No respiratory distress.  Abdominal: Normal appearance. He exhibits no distension. There is no CVA tenderness.  Musculoskeletal:       Lumbar back: He exhibits decreased range of motion (secondary to pain), tenderness, pain and spasm. He exhibits no deformity.       Back:  Lumbar TTP over midline and bilaterally in paraspinous muscles without bony deformity or crepitus, no step offs. Mild spasm noted. Neg SLR bilaterally. Strength 5/5 in all extremities, sensation grossly intact in all extremities, able to ambulate without assistance. Hips nonTTP and without deformity or crepitus No warmth over skin of all areas of back and hips  Neurological: He is alert and oriented to person, place, and time.  He has normal strength. No sensory deficit. Gait normal.  Skin: Skin is warm, dry and intact. No rash noted.  Psychiatric: He has a normal mood and affect.    ED Course  Procedures (including critical care time) Labs Review Labs Reviewed - No data to display  Imaging Review No results found.   EKG Interpretation None      MDM   Final diagnoses:  Bilateral low back pain without sciatica  Back muscle spasm   42y/o male with back pain after lifting heavy furniture. No red flag s/s of low back pain. No s/s of central cord compression or cauda equina. Lower extremities are neurovascularly intact and patient is ambulating without  difficulty. Chart review notable for a visit last week with a similar story and complaint. Doubt need for emergent imaging at this time. Vicodin given in triage.  Patient was counseled on back pain precautions and told to do activity as tolerated but do not lift, push, or pull heavy objects more than 10 pounds for the next week. Patient counseled to use ice or heat on back for no longer than 15 minutes every hour.   Rx given for muscle relaxer and counseled on proper use of muscle relaxant medication. Rx given for narcotic pain medicine and counseled on proper use of narcotic pain medications. Told that they can increase to every 4 hrs if needed while pain is worse. Counseled not to combine this medication with others containing tylenol. Urged patient not to drink alcohol, drive, or perform any other activities that requires focus while taking either of these medications.   Patient urged to follow-up with PCP if pain does not improve with treatment and rest or if pain becomes recurrent. Urged to return with worsening severe pain, loss of bowel or bladder control, trouble walking. The patient verbalizes understanding and agrees with the plan.    BP 128/72  Pulse 70  Temp(Src) 98.1 F (36.7 C) (Oral)  Resp 18  Ht  (1.88 m)  Wt 225 lb (102.059 kg)  BMI 28.88 kg/m2  SpO2 99%  Meds ordered this encounter  Medications  . HYDROcodone-acetaminophen (NORCO/VICODIN) 5-325 MG per tablet 2 tablet    Sig:   . HYDROcodone-acetaminophen (NORCO) 5-325 MG per tablet    Sig: Take 1-2 tablets by mouth every 6 (six) hours as needed for severe pain.    Dispense:  6 tablet    Refill:  0    Order Specific Question:  Supervising Provider    Answer:  Eber Hong D [[id number]]  . naproxen (NAPROSYN) 500 MG tablet    Sig: Take 1 tablet (500 mg total) by mouth 2 (two) times daily as needed for mild pain, moderate pain or headache (TAKE WITH MEALS.).    Dispense:  10 tablet    Refill:  0    Order Specific  Question:  Supervising Provider    Answer:  Eber Hong D [[id number]]  . cyclobenzaprine (FLEXERIL) 10 MG tablet    Sig: Take 1 tablet (10 mg total) by mouth 3 (three) times daily as needed for muscle spasms.    Dispense:  10 tablet    Refill:  0    Order Specific Question:  Supervising Provider    Answer:  Vida Roller [address] Camprubi-Soms, PA-C 07/09/14 [phone]

## 2014-07-09 NOTE — ED Provider Notes (Signed)
Medical screening examination/treatment/procedure(s) were performed by non-physician practitioner and as supervising physician I was immediately available for consultation/collaboration.   EKG Interpretation None        Tomasita Crumble, MD 07/09/14 1732

## 2014-07-18 ENCOUNTER — Ambulatory Visit (HOSPITAL_COMMUNITY): Payer: Self-pay | Admitting: Licensed Clinical Social Worker

## 2014-07-18 ENCOUNTER — Ambulatory Visit (HOSPITAL_COMMUNITY): Payer: Self-pay | Admitting: Psychiatry

## 2014-07-25 ENCOUNTER — Other Ambulatory Visit (HOSPITAL_COMMUNITY): Payer: Self-pay | Admitting: *Deleted

## 2014-07-25 ENCOUNTER — Telehealth (HOSPITAL_COMMUNITY): Payer: Self-pay

## 2014-07-25 MED ORDER — METHYLPHENIDATE HCL ER (OSM) 36 MG PO TBCR: 36.0000 mg | EXTENDED_RELEASE_TABLET | Freq: Every day | ORAL | Status: DC

## 2014-07-25 NOTE — Telephone Encounter (Signed)
Left [id number] r/s by office. Needs refill of Concerta

## 2014-07-25 NOTE — Telephone Encounter (Signed)
07/25/14 4:57pm Patient ZO#[id number] came and pick-up rx script.Marland KitchenMarguerite Olea

## 2014-07-29 ENCOUNTER — Ambulatory Visit (HOSPITAL_COMMUNITY): Payer: Self-pay | Admitting: Psychiatry

## 2014-08-04 ENCOUNTER — Emergency Department (HOSPITAL_COMMUNITY)
Admission: EM | Admit: 2014-08-04 | Discharge: 2014-08-04 | Disposition: A | Discharge status: Home / Self Care | Payer: BC Managed Care – PPO | Source: Home / Self Care

## 2014-08-04 ENCOUNTER — Encounter (HOSPITAL_COMMUNITY): Payer: Self-pay | Admitting: Emergency Medicine

## 2014-08-04 DIAGNOSIS — S39012D Strain of muscle, fascia and tendon of lower back, subsequent encounter: Secondary | ICD-10-CM | POA: Diagnosis not present

## 2014-08-04 DIAGNOSIS — G8929 Other chronic pain: Secondary | ICD-10-CM | POA: Diagnosis not present

## 2014-08-04 DIAGNOSIS — M545 Low back pain, unspecified: Secondary | ICD-10-CM

## 2014-08-04 MED ORDER — PREDNISONE 10 MG PO KIT: PACK | ORAL | Status: DC

## 2014-08-04 MED ORDER — TRAMADOL HCL 50 MG PO TABS: 50.0000 mg | ORAL_TABLET | Freq: Four times a day (QID) | ORAL | Status: DC | PRN

## 2014-08-04 MED ORDER — METAXALONE 800 MG PO TABS: 800.0000 mg | ORAL_TABLET | Freq: Three times a day (TID) | ORAL | Status: DC

## 2014-08-04 NOTE — Discharge Instructions (Signed)
Back Pain, Adult °Low back pain is very common. About 1 in 5 people have back pain. The cause of low back pain is rarely dangerous. The pain often gets better over time. About half of people with a sudden onset of back pain feel better in just 2 weeks. About 8 in 10 people feel better by 6 weeks.  °CAUSES °Some common causes of back pain include: °· Strain of the muscles or ligaments supporting the spine. °· Wear and tear (degeneration) of the spinal discs. °· Arthritis. °· Direct injury to the back. °DIAGNOSIS °Most of the time, the direct cause of low back pain is not known. However, back pain can be treated effectively even when the exact cause of the pain is unknown. Answering your caregiver's questions about your overall health and symptoms is one of the most accurate ways to make sure the cause of your pain is not dangerous. If your caregiver needs more information, he or she may order lab work or imaging tests (X-rays or MRIs). However, even if imaging tests show changes in your back, this usually does not require surgery. °HOME CARE INSTRUCTIONS °For many people, back pain returns. Since low back pain is rarely dangerous, it is often a condition that people can learn to manage on their own.  °· Remain active. It is stressful on the back to sit or stand in one place. Do not sit, drive, or stand in one place for more than 30 minutes at a time. Take short walks on level surfaces as soon as pain allows. Try to increase the length of time you walk each day. °· Do not stay in bed. Resting more than 1 or 2 days can delay your recovery. °· Do not avoid exercise or work. Your body is made to move. It is not dangerous to be active, even though your back may hurt. Your back will likely heal faster if you return to being active before your pain is gone. °· Pay attention to your body when you  bend and lift. Many people have less discomfort when lifting if they bend their knees, keep the load close to their bodies, and  avoid twisting. Often, the most comfortable positions are those that put less stress on your recovering back. °· Find a comfortable position to sleep. Use a firm mattress and lie on your side with your knees slightly bent. If you lie on your back, put a pillow under your knees. °· Only take over-the-counter or prescription medicines as directed by your caregiver. Over-the-counter medicines to reduce pain and inflammation are often the most helpful. Your caregiver may prescribe muscle relaxant drugs. These medicines help dull your pain so you can more quickly return to your normal activities and healthy exercise. °· Put ice on the injured area. °¨ Put ice in a plastic bag. °¨ Place a towel between your skin and the bag. °¨ Leave the ice on for 15-20 minutes, 03-04 times a day for the first 2 to 3 days. After that, ice and heat may be alternated to reduce pain and spasms. °· Ask your caregiver about trying back exercises and gentle massage. This may be of some benefit. °· Avoid feeling anxious or stressed. Stress increases muscle tension and can worsen back pain. It is important to recognize when you are anxious or stressed and learn ways to manage it. Exercise is a great option. °SEEK MEDICAL CARE IF: °· You have pain that is not relieved with rest or medicine. °· You have pain that does not improve in 1 week. °· You have new symptoms. °· You are generally not feeling well. °SEEK   IMMEDIATE MEDICAL CARE IF:   You have pain that radiates from your back into your legs.  You develop new bowel or bladder control problems.  You have unusual weakness or numbness in your arms or legs.  You develop nausea or vomiting.  You develop abdominal pain.  You feel faint. Document Released: 10/18/2005 Document Revised: 04/18/2012 Document Reviewed: 02/19/2014 [facility] Patient Information 2015 [place], [place]. This information is not intended to replace advice given to you by your health care provider. Make sure you  discuss any questions you have with your health care provider.  Chronic Back Pain  When back pain lasts longer than 3 months, it is called chronic back pain.People with chronic back pain often go through certain periods that are more intense (flare-ups).  CAUSES Chronic back pain can be caused by wear and tear (degeneration) on different structures in your back. These structures include:  The bones of your spine (vertebrae) and the joints surrounding your spinal cord and nerve roots (facets).  The strong, fibrous tissues that connect your vertebrae (ligaments). Degeneration of these structures may result in pressure on your nerves. This can lead to constant pain. HOME CARE INSTRUCTIONS  Avoid bending, heavy lifting, prolonged sitting, and activities which make the problem worse.  Take brief periods of rest throughout the day to reduce your pain. Lying down or standing usually is better than sitting while you are resting.  Take over-the-counter or prescription medicines only as directed by your caregiver. SEEK IMMEDIATE MEDICAL CARE IF:   You have weakness or numbness in one of your legs or feet.  You have trouble controlling your bladder or bowels.  You have nausea, vomiting, abdominal pain, shortness of breath, or fainting. Document Released: 11/25/2004 Document Revised: 01/10/2012 Document Reviewed: 10/02/2011 [facility] Patient Information 2015 [place], [place]. This information is not intended to replace advice given to you by your health care provider. Make sure you discuss any questions you have with your health care provider.  Low Back Strain with Rehab A strain is an injury in which a tendon or muscle is torn. The muscles and tendons of the lower back are vulnerable to strains. However, these muscles and tendons are very strong and require a great force to be injured. Strains are classified into three categories. Grade 1 strains cause pain, but the tendon is not lengthened. Grade  2 strains include a lengthened ligament, due to the ligament being stretched or partially ruptured. With grade 2 strains there is still function, although the function may be decreased. Grade 3 strains involve a complete tear of the tendon or muscle, and function is usually impaired. SYMPTOMS   Pain in the lower back.  Pain that affects one side more than the other.  Pain that gets worse with movement and may be felt in the hip, buttocks, or back of the thigh.  Muscle spasms of the muscles in the back.  Swelling along the muscles of the back.  Loss of strength of the back muscles.  Crackling sound (crepitation) when the muscles are touched. CAUSES  Lower back strains occur when a force is placed on the muscles or tendons that is greater than they can handle. Common causes of injury include:  Prolonged overuse of the muscle-tendon units in the lower back, usually from incorrect posture.  A single violent injury or force applied to the back. RISK INCREASES WITH:  Sports that involve twisting forces on the spine or a lot of bending at the waist (football, rugby, weightlifting, bowling, golf,  tennis, speed skating, racquetball, swimming, running, gymnastics, diving).  Poor strength and flexibility.  Failure to warm up properly before activity.  Family history of lower back pain or disk disorders.  Previous back injury or surgery (especially fusion).  Poor posture with lifting, especially heavy objects.  Prolonged sitting, especially with poor posture. PREVENTION   Learn and use proper posture when sitting or lifting (maintain proper posture when sitting, lift using the knees and legs, not at the waist).  Warm up and stretch properly before activity.  Allow for adequate recovery between workouts.  Maintain physical fitness:  Strength, flexibility, and endurance.  Cardiovascular fitness. PROGNOSIS  If treated properly, lower back strains usually heal within 6  weeks. RELATED COMPLICATIONS   Recurring symptoms, resulting in a chronic problem.  Chronic inflammation, scarring, and partial muscle-tendon tear.  Delayed healing or resolution of symptoms.  Prolonged disability. TREATMENT  Treatment first involves the use of ice and medicine, to reduce pain and inflammation. The use of strengthening and stretching exercises may help reduce pain with activity. These exercises may be performed at home or with a therapist. Severe injuries may require referral to a therapist for further evaluation and treatment, such as ultrasound. Your caregiver may advise that you wear a back brace or corset, to help reduce pain and discomfort. Often, prolonged bed rest results in greater harm then benefit. Corticosteroid injections may be recommended. However, these should be reserved for the most serious cases. It is important to avoid using your back when lifting objects. At night, sleep on your back on a firm mattress with a pillow placed under your knees. If non-surgical treatment is unsuccessful, surgery may be needed.  MEDICATION   If pain medicine is needed, nonsteroidal anti-inflammatory medicines (aspirin and ibuprofen), or other minor pain relievers (acetaminophen), are often advised.  Do not take pain medicine for 7 days before surgery.  Prescription pain relievers may be given, if your caregiver thinks they are needed. Use only as directed and only as much as you need.  Ointments applied to the skin may be helpful.  Corticosteroid injections may be given by your caregiver. These injections should be reserved for the most serious cases, because they may only be given a certain number of times. HEAT AND COLD  Cold treatment (icing) should be applied for 10 to 15 minutes every 2 to 3 hours for inflammation and pain, and immediately after activity that aggravates your symptoms. Use ice packs or an ice massage.  Heat treatment may be used before performing  stretching and strengthening activities prescribed by your caregiver, physical therapist, or athletic trainer. Use a heat pack or a warm water soak. SEEK MEDICAL CARE IF:   Symptoms get worse or do not improve in 2 to 4 weeks, despite treatment.  You develop numbness, weakness, or loss of bowel or bladder function.  New, unexplained symptoms develop. (Drugs used in treatment may produce side effects.) EXERCISES  RANGE OF MOTION (ROM) AND STRETCHING EXERCISES - Low Back Strain Most people with lower back pain will find that their symptoms get worse with excessive bending forward (flexion) or arching at the lower back (extension). The exercises which will help resolve your symptoms will focus on the opposite motion.  Your physician, physical therapist or athletic trainer will help you determine which exercises will be most helpful to resolve your lower back pain. Do not complete any exercises without first consulting with your caregiver. Discontinue any exercises which make your symptoms worse until you speak to  your caregiver.  If you have pain, numbness or tingling which travels down into your buttocks, leg or foot, the goal of the therapy is for these symptoms to move closer to your back and eventually resolve. Sometimes, these leg symptoms will get better, but your lower back pain may worsen. This is typically an indication of progress in your rehabilitation. Be very alert to any changes in your symptoms and the activities in which you participated in the 24 hours prior to the change. Sharing this information with your caregiver will allow him/her to most efficiently treat your condition.  These exercises may help you when beginning to rehabilitate your injury. Your symptoms may resolve with or without further involvement from your physician, physical therapist or athletic trainer. While completing these exercises, remember:  Restoring tissue flexibility helps normal motion to return to the joints.  This allows healthier, less painful movement and activity.  An effective stretch should be held for at least 30 seconds.  A stretch should never be painful. You should only feel a gentle lengthening or release in the stretched tissue. FLEXION RANGE OF MOTION AND STRETCHING EXERCISES: STRETCH - Flexion, Single Knee to Chest   Lie on a firm bed or floor with both legs extended in front of you.  Keeping one leg in contact with the floor, bring your opposite knee to your chest. Hold your leg in place by either grabbing behind your thigh or at your knee.  Pull until you feel a gentle stretch in your lower back. Hold __________ seconds.  Slowly release your grasp and repeat the exercise with the opposite side. Repeat __________ times. Complete this exercise __________ times per day.  STRETCH - Flexion, Double Knee to Chest   Lie on a firm bed or floor with both legs extended in front of you.  Keeping one leg in contact with the floor, bring your opposite knee to your chest.  Tense your stomach muscles to support your back and then lift your other knee to your chest. Hold your legs in place by either grabbing behind your thighs or at your knees.  Pull both knees toward your chest until you feel a gentle stretch in your lower back. Hold __________ seconds.  Tense your stomach muscles and slowly return one leg at a time to the floor. Repeat __________ times. Complete this exercise __________ times per day.  STRETCH - Low Trunk Rotation  Lie on a firm bed or floor. Keeping your legs in front of you, bend your knees so they are both pointed toward the ceiling and your feet are flat on the floor.  Extend your arms out to the side. This will stabilize your upper body by keeping your shoulders in contact with the floor.  Gently and slowly drop both knees together to one side until you feel a gentle stretch in your lower back. Hold for __________ seconds.  Tense your stomach muscles to support  your lower back as you bring your knees back to the starting position. Repeat the exercise to the other side. Repeat __________ times. Complete this exercise __________ times per day  EXTENSION RANGE OF MOTION AND FLEXIBILITY EXERCISES: STRETCH - Extension, Prone on Elbows   Lie on your stomach on the floor, a bed will be too soft. Place your palms about shoulder width apart and at the height of your head.  Place your elbows under your shoulders. If this is too painful, stack pillows under your chest.  Allow your body to relax so  that your hips drop lower and make contact more completely with the floor.  Hold this position for __________ seconds.  Slowly return to lying flat on the floor. Repeat __________ times. Complete this exercise __________ times per day.  RANGE OF MOTION - Extension, Prone Press Ups  Lie on your stomach on the floor, a bed will be too soft. Place your palms about shoulder width apart and at the height of your head.  Keeping your back as relaxed as possible, slowly straighten your elbows while keeping your hips on the floor. You may adjust the placement of your hands to maximize your comfort. As you gain motion, your hands will come more underneath your shoulders.  Hold this position __________ seconds.  Slowly return to lying flat on the floor. Repeat __________ times. Complete this exercise __________ times per day.  RANGE OF MOTION- Quadruped, Neutral Spine   Assume a hands and knees position on a firm surface. Keep your hands under your shoulders and your knees under your hips. You may place padding under your knees for comfort.  Drop your head and point your tail bone toward the ground below you. This will round out your lower back like an angry cat. Hold this position for __________ seconds.  Slowly lift your head and release your tail bone so that your back sags into a large arch, like an old horse.  Hold this position for __________ seconds.  Repeat  this until you feel limber in your lower back.  Now, find your "sweet spot." This will be the most comfortable position somewhere between the two previous positions. This is your neutral spine. Once you have found this position, tense your stomach muscles to support your lower back.  Hold this position for __________ seconds. Repeat __________ times. Complete this exercise __________ times per day.  STRENGTHENING EXERCISES - Low Back Strain These exercises may help you when beginning to rehabilitate your injury. These exercises should be done near your "sweet spot." This is the neutral, low-back arch, somewhere between fully rounded and fully arched, that is your least painful position. When performed in this safe range of motion, these exercises can be used for people who have either a flexion or extension based injury. These exercises may resolve your symptoms with or without further involvement from your physician, physical therapist or athletic trainer. While completing these exercises, remember:   Muscles can gain both the endurance and the strength needed for everyday activities through controlled exercises.  Complete these exercises as instructed by your physician, physical therapist or athletic trainer. Increase the resistance and repetitions only as guided.  You may experience muscle soreness or fatigue, but the pain or discomfort you are trying to eliminate should never worsen during these exercises. If this pain does worsen, stop and make certain you are following the directions exactly. If the pain is still present after adjustments, discontinue the exercise until you can discuss the trouble with your caregiver. STRENGTHENING - Deep Abdominals, Pelvic Tilt  Lie on a firm bed or floor. Keeping your legs in front of you, bend your knees so they are both pointed toward the ceiling and your feet are flat on the floor.  Tense your lower abdominal muscles to press your lower back into the  floor. This motion will rotate your pelvis so that your tail bone is scooping upwards rather than pointing at your feet or into the floor.  With a gentle tension and even breathing, hold this position for __________ seconds. Repeat  __________ times. Complete this exercise __________ times per day.  STRENGTHENING - Abdominals, Crunches   Lie on a firm bed or floor. Keeping your legs in front of you, bend your knees so they are both pointed toward the ceiling and your feet are flat on the floor. Cross your arms over your chest.  Slightly tip your chin down without bending your neck.  Tense your abdominals and slowly lift your trunk high enough to just clear your shoulder blades. Lifting higher can put excessive stress on the lower back and does not further strengthen your abdominal muscles.  Control your return to the starting position. Repeat __________ times. Complete this exercise __________ times per day.  STRENGTHENING - Quadruped, Opposite UE/LE Lift   Assume a hands and knees position on a firm surface. Keep your hands under your shoulders and your knees under your hips. You may place padding under your knees for comfort.  Find your neutral spine and gently tense your abdominal muscles so that you can maintain this position. Your shoulders and hips should form a rectangle that is parallel with the floor and is not twisted.  Keeping your trunk steady, lift your right hand no higher than your shoulder and then your left leg no higher than your hip. Make sure you are not holding your breath. Hold this position __________ seconds.  Continuing to keep your abdominal muscles tense and your back steady, slowly return to your starting position. Repeat with the opposite arm and leg. Repeat __________ times. Complete this exercise __________ times per day.  STRENGTHENING - Lower Abdominals, Double Knee Lift  Lie on a firm bed or floor. Keeping your legs in front of you, bend your knees so they  are both pointed toward the ceiling and your feet are flat on the floor.  Tense your abdominal muscles to brace your lower back and slowly lift both of your knees until they come over your hips. Be certain not to hold your breath.  Hold __________ seconds. Using your abdominal muscles, return to the starting position in a slow and controlled manner. Repeat __________ times. Complete this exercise __________ times per day.  POSTURE AND BODY MECHANICS CONSIDERATIONS - Low Back Strain Keeping correct posture when sitting, standing or completing your activities will reduce the stress put on different body tissues, allowing injured tissues a chance to heal and limiting painful experiences. The following are general guidelines for improved posture. Your physician or physical therapist will provide you with any instructions specific to your needs. While reading these guidelines, remember:  The exercises prescribed by your provider will help you have the flexibility and strength to maintain correct postures.  The correct posture provides the best environment for your joints to work. All of your joints have less wear and tear when properly supported by a spine with good posture. This means you will experience a healthier, less painful body.  Correct posture must be practiced with all of your activities, especially prolonged sitting and standing. Correct posture is as important when doing repetitive low-stress activities (typing) as it is when doing a single heavy-load activity (lifting). RESTING POSITIONS Consider which positions are most painful for you when choosing a resting position. If you have pain with flexion-based activities (sitting, bending, stooping, squatting), choose a position that allows you to rest in a less flexed posture. You would want to avoid curling into a fetal position on your side. If your pain worsens with extension-based activities (prolonged standing, working overhead), avoid  resting in  an extended position such as sleeping on your stomach. Most people will find more comfort when they rest with their spine in a more neutral position, neither too rounded nor too arched. Lying on a non-sagging bed on your side with a pillow between your knees, or on your back with a pillow under your knees will often provide some relief. Keep in mind, being in any one position for a prolonged period of time, no matter how correct your posture, can still lead to stiffness. PROPER SITTING POSTURE In order to minimize stress and discomfort on your spine, you must sit with correct posture. Sitting with good posture should be effortless for a healthy body. Returning to good posture is a gradual process. Many people can work toward this most comfortably by using various supports until they have the flexibility and strength to maintain this posture on their own. When sitting with proper posture, your ears will fall over your shoulders and your shoulders will fall over your hips. You should use the back of the chair to support your upper back. Your lower back will be in a neutral position, just slightly arched. You may place a small pillow or folded towel at the base of your lower back for support.  When working at a desk, create an environment that supports good, upright posture. Without extra support, muscles tire, which leads to excessive strain on joints and other tissues. Keep these recommendations in mind: CHAIR:  A chair should be able to slide under your desk when your back makes contact with the back of the chair. This allows you to work closely.  The chair's height should allow your eyes to be level with the upper part of your monitor and your hands to be slightly lower than your elbows. BODY POSITION  Your feet should make contact with the floor. If this is not possible, use a foot rest.  Keep your ears over your shoulders. This will reduce stress on your neck and lower back. INCORRECT  SITTING POSTURES  If you are feeling tired and unable to assume a healthy sitting posture, do not slouch or slump. This puts excessive strain on your back tissues, causing more damage and pain. Healthier options include:  Using more support, like a lumbar pillow.  Switching tasks to something that requires you to be upright or walking.  Talking a brief walk.  Lying down to rest in a neutral-spine position. PROLONGED STANDING WHILE SLIGHTLY LEANING FORWARD  When completing a task that requires you to lean forward while standing in one place for a long time, place either foot up on a stationary 2-4 inch high object to help maintain the best posture. When both feet are on the ground, the lower back tends to lose its slight inward curve. If this curve flattens (or becomes too large), then the back and your other joints will experience too much stress, tire more quickly, and can cause pain. CORRECT STANDING POSTURES Proper standing posture should be assumed with all daily activities, even if they only take a few moments, like when brushing your teeth. As in sitting, your ears should fall over your shoulders and your shoulders should fall over your hips. You should keep a slight tension in your abdominal muscles to brace your spine. Your tailbone should point down to the ground, not behind your body, resulting in an over-extended swayback posture.  INCORRECT STANDING POSTURES  Common incorrect standing postures include a forward head, locked knees and/or an excessive swayback. WALKING Walk with  an upright posture. Your ears, shoulders and hips should all line-up. PROLONGED ACTIVITY IN A FLEXED POSITION When completing a task that requires you to bend forward at your waist or lean over a low surface, try to find a way to stabilize 3 out of 4 of your limbs. You can place a hand or elbow on your thigh or rest a knee on the surface you are reaching across. This will provide you more stability so that your  muscles do not fatigue as quickly. By keeping your knees relaxed, or slightly bent, you will also reduce stress across your lower back. CORRECT LIFTING TECHNIQUES DO :   Assume a wide stance. This will provide you more stability and the opportunity to get as close as possible to the object which you are lifting.  Tense your abdominals to brace your spine. Bend at the knees and hips. Keeping your back locked in a neutral-spine position, lift using your leg muscles. Lift with your legs, keeping your back straight.  Test the weight of unknown objects before attempting to lift them.  Try to keep your elbows locked down at your sides in order get the best strength from your shoulders when carrying an object.  Always ask for help when lifting heavy or awkward objects. INCORRECT LIFTING TECHNIQUES DO NOT:   Lock your knees when lifting, even if it is a small object.  Bend and twist. Pivot at your feet or move your feet when needing to change directions.  Assume that you can safely pick up even a paper clip without proper posture. Document Released: 10/18/2005 Document Revised: 01/10/2012 Document Reviewed: 01/30/2009 [facility] Patient Information 2015 [place], [place]. This information is not intended to replace advice given to you by your health care provider. Make sure you discuss any questions you have with your health care provider.

## 2014-08-04 NOTE — ED Notes (Signed)
C/o back pain onset Fri. Eased off a little yesterday and woke him up twice last night.  He takes Neurontin, and also took Advil and Aleve and tried a heating pad.  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

## 2014-08-04 NOTE — ED Notes (Signed)
States he is not allergic to Benadryl, MS, Tramadol and Abilify does not cause hallucinations. He told me it was put in by error and to take it out.  He said he is only allergic to Aspirin

## 2014-08-04 NOTE — ED Provider Notes (Signed)
CSN: [id number]     Arrival date & time 08/04/14  1719 History   First MD Initiated Contact with Patient 08/04/14 1730     Chief Complaint  Patient presents with  . Back Pain   (Consider location/radiation/quality/duration/timing/severity/associated sxs/prior Treatment) HPI Comments: 43 year old male with a history of chronic low back pain for 4-5 years. 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 He began to experience pain in the right low back at that time period is beginning to get worse over the ensuing 24-48 hours. It is located in the right low back. It is worse with bending forward and rotating to the left. There is some discomfort in the past. Denies radicular pain, focal weakness or paresthesia. It is noted that he has been receiving prescriptions on a regular basis for scheduled to P waves since January of this year. He last visited the emergency department and received a Vicodin on September 8. 3 days earlier he received a prescription for Percocet #20. He has a long list of scheduled opioids as well as methylphenidate over the past year. For most months of 2015 thus far he has received 2 prescriptions for opiods per month..   Past Medical History  Diagnosis Date  . COPD (chronic obstructive pulmonary disease)   . Arthritis   . Seizures     only once 12/30/2012  . Depression   . Bipolar disorder   . ADD (attention deficit disorder)    Past Surgical History  Procedure Laterality Date  . Mandible fracture surgery  2014  . Appendectomy  2014   Family History  Problem Relation Age of Onset  . Diabetes Mother    History  Substance Use Topics  . Smoking status: Current Every Day Smoker -- 0.50 packs/day    Types: Cigarettes  . Smokeless tobacco: Never Used  . Alcohol Use: No    Review of Systems  Constitutional: Positive for activity change. Negative for fever and fatigue.  Respiratory: Negative.   Gastrointestinal: Negative.   Genitourinary: Negative.    Musculoskeletal: Positive for back pain, myalgias and neck pain.       As per HPI  Skin: Negative.   Neurological: Negative for dizziness, weakness, numbness and headaches.    Allergies  Aspirin  Home Medications   Prior to Admission medications   Medication Sig Start Date End Date Taking? Authorizing Provider  b complex vitamins tablet Take 1 tablet by mouth daily.   Yes Historical Provider, MD  gabapentin (NEURONTIN) 400 MG capsule Take 1 capsule (400 mg total) by mouth 3 (three) times daily. 06/17/14  Yes Meghan Blankmann, NP  ibuprofen (ADVIL,MOTRIN) 200 MG tablet Take 800 mg by mouth daily as needed for moderate pain.   Yes Historical Provider, MD  methylphenidate (CONCERTA) 36 MG PO CR tablet Take 1 tablet (36 mg total) by mouth daily. 07/25/14 07/25/15 Yes Leonides Grills, MD  traZODone (DESYREL) 100 MG tablet Take 1 tablet (100 mg total) by mouth at bedtime. 06/17/14  Yes Meghan Blankmann, NP  metaxalone (SKELAXIN) 800 MG tablet Take 1 tablet (800 mg total) by mouth 3 (three) times daily. 08/04/14   Janne Napoleon, NP  naproxen (NAPROSYN) 500 MG tablet Take 1 tablet (500 mg total) by mouth 2 (two) times daily as needed for mild pain, moderate pain or headache (TAKE WITH MEALS.). 07/09/14   Patty Sermons Camprubi-Soms, PA-C  PredniSONE 10 MG KIT Take as directed. 08/04/14   Janne Napoleon, NP  traMADol (ULTRAM) 50 MG  tablet Take 1 tablet (50 mg total) by mouth every 6 (six) hours as needed. 08/04/14   Janne Napoleon, NP   BP 125/77  Pulse 67  Temp(Src) 98.4 F (36.9 C) (Oral)  Resp 14  SpO2 100% Physical Exam  Nursing note and vitals reviewed. Constitutional: He is oriented to person, place, and time. He appears well-developed and well-nourished. No distress.  HENT:  Head: Normocephalic and atraumatic.  Eyes: EOM are normal. Left eye exhibits no discharge.  Neck: Normal range of motion. Neck supple.  Cardiovascular: Normal rate, regular rhythm and normal heart sounds.    Pulmonary/Chest: Effort normal and breath sounds normal. He has no wheezes.  Musculoskeletal:  Decreased range of motion to the lower back/spine. Reproducible tenderness to the right para lower thoracic and lumbar musculature. No spinal tenderness. No deformity palpated the spine. Interestingly, application of axial load from the vertex of the cranium produces pain to the low back.  Neurological: He is alert and oriented to person, place, and time. No cranial nerve deficit.  Skin: Skin is warm and dry.  Psychiatric: He has a normal mood and affect.    ED Course  Procedures (including critical care time) Labs Review Labs Reviewed - No data to display  Imaging Review No results found.   MDM   1. Chronic lower back pain   2. Acute low back pain   3. Lumbar strain, subsequent encounter    Tramdol #15 Skelaxin Sterapred dose pack. Heat and instructions. Keep appt with PCP on Oct 17.      Janne Napoleon, NP 08/04/14 Vernelle Emerald

## 2014-08-06 NOTE — ED Provider Notes (Signed)
Medical screening examination/treatment/procedure(s) were performed by resident physician or non-physician practitioner and as supervising physician I was immediately available for consultation/collaboration.   Franciscojavier Wronski DOUGLAS MD.   Tajuanna Burnett D Jaydrien Wassenaar, MD 08/06/14 1127 

## 2014-08-07 ENCOUNTER — Ambulatory Visit (HOSPITAL_COMMUNITY): Payer: Self-pay | Admitting: Psychiatry

## 2014-08-10 ENCOUNTER — Encounter (HOSPITAL_COMMUNITY): Payer: Self-pay | Admitting: Emergency Medicine

## 2014-08-10 ENCOUNTER — Emergency Department (HOSPITAL_COMMUNITY)
Admission: EM | Admit: 2014-08-10 | Discharge: 2014-08-10 | Disposition: A | Discharge status: Home / Self Care | Payer: Medicaid Other | Attending: Emergency Medicine | Admitting: Emergency Medicine

## 2014-08-10 DIAGNOSIS — F909 Attention-deficit hyperactivity disorder, unspecified type: Secondary | ICD-10-CM | POA: Insufficient documentation

## 2014-08-10 DIAGNOSIS — F319 Bipolar disorder, unspecified: Secondary | ICD-10-CM | POA: Insufficient documentation

## 2014-08-10 DIAGNOSIS — M549 Dorsalgia, unspecified: Secondary | ICD-10-CM

## 2014-08-10 DIAGNOSIS — M545 Low back pain: Secondary | ICD-10-CM | POA: Insufficient documentation

## 2014-08-10 DIAGNOSIS — G40909 Epilepsy, unspecified, not intractable, without status epilepticus: Secondary | ICD-10-CM | POA: Insufficient documentation

## 2014-08-10 DIAGNOSIS — Z720 Tobacco use: Secondary | ICD-10-CM | POA: Insufficient documentation

## 2014-08-10 DIAGNOSIS — M1990 Unspecified osteoarthritis, unspecified site: Secondary | ICD-10-CM | POA: Diagnosis not present

## 2014-08-10 DIAGNOSIS — Z79899 Other long term (current) drug therapy: Secondary | ICD-10-CM | POA: Insufficient documentation

## 2014-08-10 DIAGNOSIS — G8929 Other chronic pain: Secondary | ICD-10-CM | POA: Diagnosis not present

## 2014-08-10 DIAGNOSIS — J449 Chronic obstructive pulmonary disease, unspecified: Secondary | ICD-10-CM | POA: Diagnosis not present

## 2014-08-10 MED ORDER — METHOCARBAMOL 500 MG PO TABS: 500.0000 mg | ORAL_TABLET | Freq: Two times a day (BID) | ORAL | Status: DC

## 2014-08-10 MED ORDER — OXYCODONE-ACETAMINOPHEN 5-325 MG PO TABS
2.0000 | ORAL_TABLET | Freq: Once | ORAL | Status: AC
  Administered 2014-08-10: 2 via ORAL
  Filled 2014-08-10: qty 2

## 2014-08-10 NOTE — ED Provider Notes (Signed)
CSN: [id number]     Arrival date & time 08/10/14  1227 History  This chart was scribed for Linus Mako, PA, working with Fredia Sorrow, MD found by Starleen Arms, ED Scribe. This patient was seen in room TR06C/TR06C and the patient's care was started at 1:23 PM.   Chief Complaint  Patient presents with  . Back Pain   The history is provided by the patient. No language interpreter was used.   HPI Comments: [name] is a 43 y.o. male with a history of 5 years of chronic back pain  who presents to the Emergency Department complaining of lower back pain onset several days ago with bilateral radiation to his knees.  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  Patient rates his current pain at 8/10.   Patient states he has difficulty walking, standing, and laying due to pain.  Patient reports he has tried tramadol, skelaxin, ibuprofen, epsom salt soaks, icy-hot, and heating pad with minor, transient relief.  Patient is seen by Dr. Nicholaus Bloom for pain management and is scheduled to get an appointment to hopefully get an order for an MRI on 10/17.  It is noted that he has been receiving prescriptions on a regular basis for scheduled narctoics since January of this year. He last visited the emergency department and received a Vicodin on September 8. 3 days earlier he received a prescription for Percocet #20. He has a long list of scheduled opioids as well as methylphenidate over the past year. For most months of 2015 thus far he has received 2 prescriptions for opiods per month..  He was seen at the Urgent Care on 08/04/2014 and given Skelaxin, prednisone dose pack,  And Ultram.  PCP: Elenora Fender Past Medical History  Diagnosis Date  . COPD (chronic obstructive pulmonary disease)   . Arthritis   . Seizures     only once 12/30/2012  . Depression   . Bipolar disorder   . ADD (attention deficit disorder)    Past Surgical History  Procedure Laterality Date  .  Mandible fracture surgery  2014  . Appendectomy  2014   Family History  Problem Relation Age of Onset  . Diabetes Mother    History  Substance Use Topics  . Smoking status: Current Every Day Smoker -- 0.50 packs/day    Types: Cigarettes  . Smokeless tobacco: Never Used  . Alcohol Use: No    Review of Systems  Musculoskeletal: Positive for arthralgias.  All other systems reviewed and are negative.     Allergies  Aspirin  Home Medications   Prior to Admission medications   Medication Sig Start Date End Date Taking? Authorizing Provider  b complex vitamins tablet Take 1 tablet by mouth daily.    Historical Provider, MD  gabapentin (NEURONTIN) 400 MG capsule Take 1 capsule (400 mg total) by mouth 3 (three) times daily. 06/17/14   Madison Hickman, NP  ibuprofen (ADVIL,MOTRIN) 200 MG tablet Take 800 mg by mouth daily as needed for moderate pain.    Historical Provider, MD  metaxalone (SKELAXIN) 800 MG tablet Take 1 tablet (800 mg total) by mouth 3 (three) times daily. 08/04/14   Janne Napoleon, NP  methocarbamol (ROBAXIN) 500 MG tablet Take 1 tablet (500 mg total) by mouth 2 (two) times daily. 08/10/14   Linus Mako, PA-C  methylphenidate (CONCERTA) 36 MG PO CR tablet Take 1 tablet (36 mg total) by mouth daily. 07/25/14 07/25/15  Charlott Rakes D  Salem Senate, MD  naproxen (NAPROSYN) 500 MG tablet Take 1 tablet (500 mg total) by mouth 2 (two) times daily as needed for mild pain, moderate pain or headache (TAKE WITH MEALS.). 07/09/14   Patty Sermons Camprubi-Soms, PA-C  PredniSONE 10 MG KIT Take as directed. 08/04/14   Janne Napoleon, NP  traMADol (ULTRAM) 50 MG tablet Take 1 tablet (50 mg total) by mouth every 6 (six) hours as needed. 08/04/14   Janne Napoleon, NP  traZODone (DESYREL) 100 MG tablet Take 1 tablet (100 mg total) by mouth at bedtime. 06/17/14   Meghan Blankmann, NP   BP 133/70  Pulse 77  Temp(Src) 98.1 F (36.7 C) (Oral)  Resp 18  Ht 6' 2"  (1.88 m)  Wt 180 lb (81.647 kg)  BMI 23.10  kg/m2  SpO2 97% Physical Exam  Nursing note and vitals reviewed. Constitutional: He is oriented to person, place, and time. He appears well-developed and well-nourished. No distress.  HENT:  Head: Normocephalic and atraumatic.  Eyes: Conjunctivae and EOM are normal.  Neck: Neck supple. No tracheal deviation present.  Cardiovascular: Normal rate.   Pulmonary/Chest: Effort normal. No respiratory distress.  Musculoskeletal: Normal range of motion.       Back:  Pt has equal strength to bilateral lower extremities.  Neurosensory function adequate to both legs No clonus on dorsiflextion Skin color is normal. Skin is warm and moist.  I see no step off deformity, no midline bony tenderness.  Pt is able to ambulate.  No crepitus, laceration, effusion, induration, lesions, swelling.   Pedal pulses are symmetrical and palpable bilaterally  moderate tenderness to palpation of paraspinel muscles   Neurological: He is alert and oriented to person, place, and time.  Skin: Skin is warm and dry.  Psychiatric: He has a normal mood and affect. His behavior is normal.    ED Course  Procedures (including critical care time)  DIAGNOSTIC STUDIES: Oxygen Saturation is 97% on RA, normal by my interpretation.    COORDINATION OF CARE:  1:35 PM Discussed plan to order pain medication.     Labs Review Labs Reviewed - No data to display  Imaging Review No results found.   EKG Interpretation None      MDM   Final diagnoses:  Chronic back pain   Will not be giving narcotic Rx at todays visit. Pt to see pain management soon.   Medications  oxyCODONE-acetaminophen (PERCOCET/ROXICET) 5-325 MG per tablet 2 tablet (2 tablets Oral Given 08/10/14 1342)   Offered Rx for Robaxin. Pt was cooperative.  43 y.o.[name]'s  with back pain. No neurological deficits and normal neuro exam. Patient can walk. No loss of bowel or bladder control. No concern for cauda equina at this time base on HPI  and physical exam findings. No fever, night sweats, weight loss, h/o cancer, IVDU.   RICE protocol and pain medicine indicated and discussed with patient.   Patient Plan 1. Medications: pain medication and muscle relaxer. Cont usual home medications unless otherwise directed. 2. Treatment: rest, drink plenty of fluids, gentle stretching as discussed, alternate ice and heat  3. Follow Up: Please followup with your primary doctor for discussion of your diagnoses and further evaluation after today's visit; if you do not have a primary care doctor use the resource guide provided to find one   Vital signs are stable at discharge. Filed Vitals:   08/10/14 1238  BP: 133/70  Pulse: 77  Temp: 98.1 F (36.7 C)  Resp: 18  Patient/guardian has voiced understanding and agreed to follow-up with the PCP or specialist.       I personally performed the services described in this documentation, which was scribed in my presence. The recorded information has been reviewed and is accurate.   Linus Mako, PA-C 08/10/14 1555

## 2014-08-10 NOTE — ED Notes (Signed)
Declined W/C at D/C and was escorted to lobby by RN. 

## 2014-08-10 NOTE — Discharge Instructions (Signed)

## 2014-08-10 NOTE — ED Notes (Signed)
Pt reports that about 4 days ago his lower back started hurting. 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

## 2014-08-10 NOTE — ED Provider Notes (Signed)
Medical screening examination/treatment/procedure(s) were performed by non-physician practitioner and as supervising physician I was immediately available for consultation/collaboration.   EKG Interpretation None       Vanetta MuldersScott Kyleah Pensabene, MD 08/10/14 1643

## 2014-08-27 ENCOUNTER — Ambulatory Visit (INDEPENDENT_AMBULATORY_CARE_PROVIDER_SITE_OTHER): Payer: 59 | Admitting: Psychiatry

## 2014-08-27 ENCOUNTER — Encounter (HOSPITAL_COMMUNITY): Payer: Self-pay | Admitting: Psychiatry

## 2014-08-27 VITALS — BP 111/71 | HR 56 | Ht 74.0 in | Wt 199.0 lb

## 2014-08-27 DIAGNOSIS — F909 Attention-deficit hyperactivity disorder, unspecified type: Secondary | ICD-10-CM

## 2014-08-27 DIAGNOSIS — F988 Other specified behavioral and emotional disorders with onset usually occurring in childhood and adolescence: Secondary | ICD-10-CM

## 2014-08-27 DIAGNOSIS — F4310 Post-traumatic stress disorder, unspecified: Secondary | ICD-10-CM

## 2014-08-27 DIAGNOSIS — F3162 Bipolar disorder, current episode mixed, moderate: Secondary | ICD-10-CM

## 2014-08-27 MED ORDER — MIRTAZAPINE 15 MG PO TABS: 15.0000 mg | ORAL_TABLET | Freq: Every day | ORAL | Status: DC

## 2014-08-27 MED ORDER — METHYLPHENIDATE HCL ER (OSM) 36 MG PO TBCR: 36.0000 mg | EXTENDED_RELEASE_TABLET | Freq: Every day | ORAL | Status: AC

## 2014-08-27 MED ORDER — LAMOTRIGINE 100 MG PO TABS: 100.0000 mg | ORAL_TABLET | Freq: Every day | ORAL | Status: AC

## 2014-08-27 MED ORDER — GABAPENTIN 400 MG PO CAPS: 400.0000 mg | ORAL_CAPSULE | Freq: Three times a day (TID) | ORAL | Status: AC
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
## 2014-08-31 ENCOUNTER — Emergency Department (HOSPITAL_COMMUNITY)
Admission: EM | Admit: 2014-08-31 | Discharge: 2014-08-31 | Disposition: A | Discharge status: Home / Self Care | Payer: BC Managed Care – PPO | Source: Home / Self Care | Attending: Family Medicine | Admitting: Family Medicine

## 2014-08-31 ENCOUNTER — Encounter (HOSPITAL_COMMUNITY): Payer: Self-pay | Admitting: Emergency Medicine

## 2014-08-31 DIAGNOSIS — G8929 Other chronic pain: Secondary | ICD-10-CM

## 2014-08-31 DIAGNOSIS — M545 Low back pain: Secondary | ICD-10-CM

## 2014-08-31 HISTORY — DX: Other chronic pain: G89.29

## 2014-08-31 HISTORY — DX: Dorsalgia, unspecified: M54.9

## 2014-08-31 MED ORDER — METAXALONE 800 MG PO TABS: 800.0000 mg | ORAL_TABLET | Freq: Three times a day (TID) | ORAL | Status: AC

## 2014-08-31 NOTE — ED Notes (Signed)
C/O flare-up of chronic low back pain since last night.  Pain in right lower back, radiating down to bilat knees.  Denies parasthesias.  Has been taking Advil and using heating pad.  Pt states he has appt @ [facility] as a new pt on Tuesday.

## 2014-08-31 NOTE — ED Provider Notes (Signed)
CSN: [id number]     Arrival date & time 08/31/14  1055 History   First MD Initiated Contact with Patient 08/31/14 1123     Chief Complaint  Patient presents with  . Back Pain   (Consider location/radiation/quality/duration/timing/severity/associated sxs/prior Treatment) Patient is a 43 y.o. male presenting with back pain. The history is provided by the patient.  Back Pain Location:  Lumbar spine Quality:  Aching Radiates to: to ant aspect above knees bilat. Pain severity:  Moderate Chronicity:  Chronic Context: lifting heavy objects   Context comment:  Onset last eve, see 10/10 ER visit with long hx of pain and rx's given since jan 2015 Ineffective treatments:  Heating pad Associated symptoms: no abdominal pain, no bladder incontinence, no bowel incontinence, no numbness, no paresthesias and no perianal numbness     Past Medical History  Diagnosis Date  . COPD (chronic obstructive pulmonary disease)   . Arthritis   . Seizures     only once 12/30/2012  . Depression   . Bipolar disorder   . ADD (attention deficit disorder)   . Chronic back pain    Past Surgical History  Procedure Laterality Date  . Mandible fracture surgery  2014  . Appendectomy  2014   Family History  Problem Relation Age of Onset  . Diabetes Mother    History  Substance Use Topics  . Smoking status: Current Every Day Smoker    Types: Cigarettes  . Smokeless tobacco: Never Used  . Alcohol Use: No    Review of Systems  Constitutional: Negative.   Cardiovascular: Negative.   Gastrointestinal: Negative.  Negative for abdominal pain and bowel incontinence.  Genitourinary: Negative.  Negative for bladder incontinence.  Musculoskeletal: Positive for back pain.  Neurological: Negative for numbness and paresthesias.    Allergies  Aspirin and Ketorolac tromethamine  Home Medications   Prior to Admission medications   Medication Sig Start Date End Date Taking? Authorizing Provider  b complex  vitamins tablet Take 1 tablet by mouth daily.   Yes Historical Provider, MD  gabapentin (NEURONTIN) 400 MG capsule Take 1 capsule (400 mg total) by mouth 3 (three) times daily. 08/27/14  Yes Cleotis NipperSyed T Arfeen, MD  ibuprofen (ADVIL,MOTRIN) 200 MG tablet Take 800 mg by mouth daily as needed for moderate pain.   Yes Historical Provider, MD  lamoTRIgine (LAMICTAL) 100 MG tablet Take 1 tablet (100 mg total) by mouth daily. 08/27/14 08/27/15 Yes Cleotis NipperSyed T Arfeen, MD  methylphenidate (CONCERTA) 36 MG PO CR tablet Take 1 tablet (36 mg total) by mouth daily. 08/27/14 08/27/15 Yes Cleotis NipperSyed T Arfeen, MD  mirtazapine (REMERON) 15 MG tablet Take 1 tablet (15 mg total) by mouth at bedtime. 08/27/14 08/27/15 Yes Cleotis NipperSyed T Arfeen, MD  HYDROcodone-acetaminophen (NORCO/VICODIN) 5-325 MG per tablet Take by mouth. 08/11/14   Historical Provider, MD  metaxalone (SKELAXIN) 800 MG tablet Take 1 tablet (800 mg total) by mouth 3 (three) times daily. 08/31/14   Linna HoffJames D Roxas Clymer, MD   BP 114/70  Pulse 70  Temp(Src) 97.5 F (36.4 C) (Oral)  Resp 16  SpO2 98% Physical Exam  Nursing note and vitals reviewed. Constitutional: He is oriented to person, place, and time. He appears well-developed and well-nourished.  Abdominal: Soft. Bowel sounds are normal. He exhibits no mass. There is no tenderness.  Musculoskeletal: He exhibits tenderness.       Lumbar back: He exhibits decreased range of motion, tenderness and pain. He exhibits no spasm and normal pulse.  Back:  Neurological: He is alert and oriented to person, place, and time.  Skin: Skin is warm and dry.    ED Course  Procedures (including critical care time) Labs Review Labs Reviewed - No data to display  Imaging Review No results found.   MDM   1. Chronic low back pain        Linna HoffJames D Marcelyn Ruppe, MD 08/31/14 1158

## 2014-08-31 NOTE — Discharge Instructions (Signed)
See your doctor as planned for further care, use medicine as prescribed.

## 2014-09-03 ENCOUNTER — Ambulatory Visit: Payer: Medicaid Other | Admitting: Family

## 2014-09-08 ENCOUNTER — Emergency Department (HOSPITAL_BASED_OUTPATIENT_CLINIC_OR_DEPARTMENT_OTHER): Payer: Medicaid Other

## 2014-09-08 ENCOUNTER — Encounter (HOSPITAL_BASED_OUTPATIENT_CLINIC_OR_DEPARTMENT_OTHER): Payer: Self-pay | Admitting: *Deleted

## 2014-09-08 ENCOUNTER — Emergency Department (HOSPITAL_BASED_OUTPATIENT_CLINIC_OR_DEPARTMENT_OTHER)
Admission: EM | Admit: 2014-09-08 | Discharge: 2014-09-08 | Disposition: A | Discharge status: Home / Self Care | Payer: Medicaid Other | Attending: Emergency Medicine | Admitting: Emergency Medicine

## 2014-09-08 DIAGNOSIS — S39012A Strain of muscle, fascia and tendon of lower back, initial encounter: Secondary | ICD-10-CM | POA: Diagnosis not present

## 2014-09-08 DIAGNOSIS — W010XXA Fall on same level from slipping, tripping and stumbling without subsequent striking against object, initial encounter: Secondary | ICD-10-CM | POA: Diagnosis not present

## 2014-09-08 DIAGNOSIS — G40909 Epilepsy, unspecified, not intractable, without status epilepticus: Secondary | ICD-10-CM | POA: Insufficient documentation

## 2014-09-08 DIAGNOSIS — Y92009 Unspecified place in unspecified non-institutional (private) residence as the place of occurrence of the external cause: Secondary | ICD-10-CM | POA: Diagnosis not present

## 2014-09-08 DIAGNOSIS — S3991XA Unspecified injury of abdomen, initial encounter: Secondary | ICD-10-CM | POA: Diagnosis not present

## 2014-09-08 DIAGNOSIS — W19XXXA Unspecified fall, initial encounter: Secondary | ICD-10-CM

## 2014-09-08 DIAGNOSIS — S79911A Unspecified injury of right hip, initial encounter: Secondary | ICD-10-CM | POA: Insufficient documentation

## 2014-09-08 DIAGNOSIS — S79912A Unspecified injury of left hip, initial encounter: Secondary | ICD-10-CM | POA: Insufficient documentation

## 2014-09-08 DIAGNOSIS — Y998 Other external cause status: Secondary | ICD-10-CM | POA: Diagnosis not present

## 2014-09-08 DIAGNOSIS — F319 Bipolar disorder, unspecified: Secondary | ICD-10-CM | POA: Insufficient documentation

## 2014-09-08 DIAGNOSIS — M1990 Unspecified osteoarthritis, unspecified site: Secondary | ICD-10-CM | POA: Insufficient documentation

## 2014-09-08 DIAGNOSIS — F1990 Other psychoactive substance use, unspecified, uncomplicated: Secondary | ICD-10-CM | POA: Insufficient documentation

## 2014-09-08 DIAGNOSIS — J449 Chronic obstructive pulmonary disease, unspecified: Secondary | ICD-10-CM | POA: Diagnosis not present

## 2014-09-08 DIAGNOSIS — Y9389 Activity, other specified: Secondary | ICD-10-CM | POA: Diagnosis not present

## 2014-09-08 DIAGNOSIS — Z720 Tobacco use: Secondary | ICD-10-CM | POA: Diagnosis not present

## 2014-09-08 DIAGNOSIS — Z79899 Other long term (current) drug therapy: Secondary | ICD-10-CM | POA: Insufficient documentation

## 2014-09-08 DIAGNOSIS — S3992XA Unspecified injury of lower back, initial encounter: Secondary | ICD-10-CM | POA: Diagnosis present

## 2014-09-08 DIAGNOSIS — G8929 Other chronic pain: Secondary | ICD-10-CM | POA: Diagnosis not present

## 2014-09-08 MED ORDER — HYDROMORPHONE HCL 1 MG/ML IJ SOLN
2.0000 mg | Freq: Once | INTRAMUSCULAR | Status: AC
  Administered 2014-09-08: 2 mg via INTRAMUSCULAR
  Filled 2014-09-08: qty 2

## 2014-09-08 MED ORDER — ONDANSETRON 8 MG PO TBDP
8.0000 mg | ORAL_TABLET | Freq: Once | ORAL | Status: AC
  Administered 2014-09-08: 8 mg via ORAL
  Filled 2014-09-08: qty 1

## 2014-09-08 MED ORDER — HYDROCODONE-ACETAMINOPHEN 5-325 MG PO TABS: 2.0000 | ORAL_TABLET | ORAL | Status: DC | PRN

## 2014-09-08 MED ORDER — PREDNISONE 20 MG PO TABS: 60.0000 mg | ORAL_TABLET | Freq: Every day | ORAL | Status: DC

## 2014-09-08 NOTE — Discharge Instructions (Signed)
Back Pain, Adult Low back pain is very common. About 1 in 5 people have back pain.The cause of low back pain is rarely dangerous. The pain often gets better over time.About half of people with a sudden onset of back pain feel better in just 2 weeks. About 8 in 10 people feel better by 6 weeks.  CAUSES Some common causes of back pain include:  Strain of the muscles or ligaments supporting the spine.  Wear and tear (degeneration) of the spinal discs.  Arthritis.  Direct injury to the back. DIAGNOSIS Most of the time, the direct cause of low back pain is not known.However, back pain can be treated effectively even when the exact cause of the pain is unknown.Answering your caregiver's questions about your overall health and symptoms is one of the most accurate ways to make sure the cause of your pain is not dangerous. If your caregiver needs more information, he or she may order lab work or imaging tests (X-rays or MRIs).However, even if imaging tests show changes in your back, this usually does not require surgery. HOME CARE INSTRUCTIONS For many people, back pain returns.Since low back pain is rarely dangerous, it is often a condition that people can learn to manageon their own.   Remain active. It is stressful on the back to sit or stand in one place. Do not sit, drive, or stand in one place for more than 30 minutes at a time. Take short walks on level surfaces as soon as pain allows.Try to increase the length of time you walk each day.  Do not stay in bed.Resting more than 1 or 2 days can delay your recovery.  Do not avoid exercise or work.Your body is made to move.It is not dangerous to be active, even though your back may hurt.Your back will likely heal faster if you return to being active before your pain is gone.  Pay attention to your body when you bend and lift. Many people have less discomfortwhen lifting if they bend their knees, keep the load close to their bodies,and  avoid twisting. Often, the most comfortable positions are those that put less stress on your recovering back.  Find a comfortable position to sleep. Use a firm mattress and lie on your side with your knees slightly bent. If you lie on your back, put a pillow under your knees.  Only take over-the-counter or prescription medicines as directed by your caregiver. Over-the-counter medicines to reduce pain and inflammation are often the most helpful.Your caregiver may prescribe muscle relaxant drugs.These medicines help dull your pain so you can more quickly return to your normal activities and healthy exercise.  Put ice on the injured area.  Put ice in a plastic bag.  Place a towel between your skin and the bag.  Leave the ice on for 15-20 minutes, 03-04 times a day for the first 2 to 3 days. After that, ice and heat may be alternated to reduce pain and spasms.  Ask your caregiver about trying back exercises and gentle massage. This may be of some benefit.  Avoid feeling anxious or stressed.Stress increases muscle tension and can worsen back pain.It is important to recognize when you are anxious or stressed and learn ways to manage it.Exercise is a great option. SEEK MEDICAL CARE IF:  You have pain that is not relieved with rest or medicine.  You have pain that does not improve in 1 week.  You have new symptoms.  You are generally not feeling well. SEEK   IMMEDIATE MEDICAL CARE IF:   You have pain that radiates from your back into your legs.  You develop new bowel or bladder control problems.  You have unusual weakness or numbness in your arms or legs.  You develop nausea or vomiting.  You develop abdominal pain.  You feel faint. Document Released: 10/18/2005 Document Revised: 04/18/2012 Document Reviewed: 02/19/2014 Exit[employer] Patient Information 2015 [employer]. This information is not intended to replace advice given to you by your health care provider. Make sure you  discuss any questions you have with your health care provider.  

## 2014-09-08 NOTE — ED Notes (Signed)
Pt slipped on concrete floor in home aroudn 530pm last night, landing on butt and states shapr shooting pain has been wrapping around bilateral hips to groin off and on since.  Pt took 800mg  ibuprofen around 1100 and goody's powder around noon today.  Denies any head or back injury.

## 2014-09-08 NOTE — ED Provider Notes (Signed)
CSN: [id number]     Arrival date & time 09/08/14  1507 History  This chart was scribed for Gilda Creasehristopher J. Pollina, * by Roxy Cedarhandni Bhalodia, ED Scribe. This patient was seen in room MH03/MH03 and the patient's care was started at 3:56 PM.  Chief Complaint  Patient presents with  . Fall   The history is provided by the patient. No language interpreter was used.     HPI Comments: [name] is a 43 y.o. male who presents to the Emergency Department complaining of bilateral hip pain and groin pain that began earlier today after he slipped and fell at home on concrete floor. He reports sharp shooting pain in his lower back and groin area.  Past Medical History  Diagnosis Date  . COPD (chronic obstructive pulmonary disease)   . Arthritis   . Seizures     only once 12/30/2012  . Depression   . Bipolar disorder   . ADD (attention deficit disorder)   . Chronic back pain    Past Surgical History  Procedure Laterality Date  . Mandible fracture surgery  2014  . Appendectomy  2014   Family History  Problem Relation Age of Onset  . Diabetes Mother    History  Substance Use Topics  . Smoking status: Current Every Day Smoker    Types: Cigarettes  . Smokeless tobacco: Never Used  . Alcohol Use: No   Review of Systems  Musculoskeletal: Positive for back pain.  All other systems reviewed and are negative.  Allergies  Aspirin and Ketorolac tromethamine  Home Medications   Prior to Admission medications   Medication Sig Start Date End Date Taking? Authorizing Provider  b complex vitamins tablet Take 1 tablet by mouth daily.   Yes Historical Provider, MD  gabapentin (NEURONTIN) 400 MG capsule Take 1 capsule (400 mg total) by mouth 3 (three) times daily. 08/27/14  Yes Cleotis NipperSyed T Arfeen, MD  lamoTRIgine (LAMICTAL) 100 MG tablet Take 1 tablet (100 mg total) by mouth daily. 08/27/14 08/27/15 Yes Cleotis NipperSyed T Arfeen, MD  metaxalone (SKELAXIN) 800 MG tablet Take 1 tablet (800 mg total) by mouth 3  (three) times daily. 08/31/14  Yes Linna HoffJames D Kindl, MD  methylphenidate (CONCERTA) 36 MG PO CR tablet Take 1 tablet (36 mg total) by mouth daily. 08/27/14 08/27/15 Yes Cleotis NipperSyed T Arfeen, MD  mirtazapine (REMERON) 15 MG tablet Take 1 tablet (15 mg total) by mouth at bedtime. 08/27/14 08/27/15 Yes Cleotis NipperSyed T Arfeen, MD  HYDROcodone-acetaminophen (NORCO/VICODIN) 5-325 MG per tablet Take by mouth. 08/11/14   Historical Provider, MD  ibuprofen (ADVIL,MOTRIN) 200 MG tablet Take 800 mg by mouth daily as needed for moderate pain.    Historical Provider, MD   Triage Vitals: BP 109/63 mmHg  Pulse 67  Temp(Src) 97.9 F (36.6 C) (Oral)  Resp 18  Ht 6\' 1"  (1.854 m)  Wt 198 lb (89.812 kg)  BMI 26.13 kg/m2  SpO2 100%  Physical Exam  Constitutional: He is oriented to person, place, and time. He appears well-developed and well-nourished. No distress.  HENT:  Head: Normocephalic and atraumatic.  Right Ear: Hearing normal.  Left Ear: Hearing normal.  Nose: Nose normal.  Mouth/Throat: Oropharynx is clear and moist and mucous membranes are normal.  Eyes: Conjunctivae and EOM are normal. Pupils are equal, round, and reactive to light.  Neck: Normal range of motion. Neck supple.  Cardiovascular: Regular rhythm, S1 normal and S2 normal.  Exam reveals no gallop and no friction rub.   No murmur  heard. Pulmonary/Chest: Effort normal and breath sounds normal. No respiratory distress. He exhibits no tenderness.  Abdominal: Soft. Normal appearance and bowel sounds are normal. There is no hepatosplenomegaly. There is no tenderness. There is no rebound, no guarding, no tenderness at McBurney's point and negative Murphy's sign. No hernia.  Musculoskeletal: Normal range of motion.  Midline tenderness, low lumbar.   Neurological: He is alert and oriented to person, place, and time. He has normal strength. No cranial nerve deficit or sensory deficit. Coordination normal. GCS eye subscore is 4. GCS verbal subscore is 5. GCS motor  subscore is 6.  Weakly positive straight leg. Raises both legs Good strength and sensation.  Skin: Skin is warm, dry and intact. No rash noted. No cyanosis.  Psychiatric: He has a normal mood and affect. His speech is normal and behavior is normal. Thought content normal.  Nursing note and vitals reviewed.  ED Course  Procedures (including critical care time)  DIAGNOSTIC STUDIES: Oxygen Saturation is 100% on RA, normal by my interpretation.    COORDINATION OF CARE: 4:17 PM- Discussed plans to order diagnostic imaging of lumbar spin and pelvis. Will give patient dilaudid and zofran. Pt advised of plan for treatment and pt agrees.  Labs Review Labs Reviewed - No data to display  Imaging Review No results found.   EKG Interpretation None     MDM   Final diagnoses:  None  Lumbar strain  She presented to the ER for evaluation of low back pain. Patient reports that he slipped and fell on a wet floor yesterday. Patient complaining of severe and constant low back pain that radiates around to the hips and groin bilaterally. Patient does have severe worsening of pain with movement. Weakly positive straight leg raises bilaterally, but normal neurologic function. No change in bowel or bladder function. X-ray of pelvis and lumbosacral spine were negative for acute injury. Patient will be treated with prednisone, analgesia.  I personally performed the services described in this documentation, which was scribed in my presence. The recorded information has been reviewed and is accurate.  Gilda Creasehristopher J. Pollina, MD 09/08/14 [phone]

## 2014-09-08 NOTE — ED Notes (Signed)
Reports slipped in water yesterday and fell, landing on buttocks- c/o hip and groin pain today

## 2014-09-22 ENCOUNTER — Encounter (HOSPITAL_BASED_OUTPATIENT_CLINIC_OR_DEPARTMENT_OTHER): Payer: Self-pay | Admitting: *Deleted

## 2014-09-22 ENCOUNTER — Emergency Department (HOSPITAL_BASED_OUTPATIENT_CLINIC_OR_DEPARTMENT_OTHER): Payer: BC Managed Care – PPO

## 2014-09-22 ENCOUNTER — Emergency Department (HOSPITAL_BASED_OUTPATIENT_CLINIC_OR_DEPARTMENT_OTHER)
Admission: EM | Admit: 2014-09-22 | Discharge: 2014-09-22 | Disposition: A | Discharge status: Home / Self Care | Payer: BC Managed Care – PPO | Attending: Emergency Medicine | Admitting: Emergency Medicine

## 2014-09-22 DIAGNOSIS — R109 Unspecified abdominal pain: Secondary | ICD-10-CM

## 2014-09-22 DIAGNOSIS — Z7952 Long term (current) use of systemic steroids: Secondary | ICD-10-CM | POA: Insufficient documentation

## 2014-09-22 DIAGNOSIS — M549 Dorsalgia, unspecified: Secondary | ICD-10-CM | POA: Diagnosis not present

## 2014-09-22 DIAGNOSIS — F319 Bipolar disorder, unspecified: Secondary | ICD-10-CM | POA: Diagnosis not present

## 2014-09-22 DIAGNOSIS — G8929 Other chronic pain: Secondary | ICD-10-CM | POA: Insufficient documentation

## 2014-09-22 DIAGNOSIS — Z79899 Other long term (current) drug therapy: Secondary | ICD-10-CM | POA: Insufficient documentation

## 2014-09-22 DIAGNOSIS — F909 Attention-deficit hyperactivity disorder, unspecified type: Secondary | ICD-10-CM | POA: Diagnosis not present

## 2014-09-22 DIAGNOSIS — Z720 Tobacco use: Secondary | ICD-10-CM | POA: Diagnosis not present

## 2014-09-22 DIAGNOSIS — J449 Chronic obstructive pulmonary disease, unspecified: Secondary | ICD-10-CM | POA: Insufficient documentation

## 2014-09-22 DIAGNOSIS — G40909 Epilepsy, unspecified, not intractable, without status epilepticus: Secondary | ICD-10-CM | POA: Insufficient documentation

## 2014-09-22 DIAGNOSIS — M1990 Unspecified osteoarthritis, unspecified site: Secondary | ICD-10-CM | POA: Diagnosis not present

## 2014-09-22 LAB — BASIC METABOLIC PANEL
ANION GAP: 11 (ref 5–15)
BUN: 20 mg/dL (ref 6–23)
CO2: 24 meq/L (ref 19–32)
Calcium: 9.7 mg/dL (ref 8.4–10.5)
Chloride: 106 mEq/L (ref 96–112)
Creatinine, Ser: 1.1 mg/dL (ref 0.50–1.35)
GFR calc Af Amer: >90 mL/min (ref >90)
GFR calc non Af Amer: 82 mL/min — ABNORMAL LOW (ref >90)
Glucose, Bld: 112 mg/dL — ABNORMAL HIGH (ref 70–99)
Potassium: 4.5 mEq/L (ref 3.7–5.3)
SODIUM: 141 meq/L (ref 137–147)

## 2014-09-22 LAB — URINALYSIS, ROUTINE W REFLEX MICROSCOPIC
Bilirubin Urine: NEGATIVE
GLUCOSE, UA: NEGATIVE mg/dL
Hgb urine dipstick: NEGATIVE
Ketones, ur: NEGATIVE mg/dL
Leukocytes, UA: NEGATIVE
Nitrite: NEGATIVE
PH: 5.5 (ref 5.0–8.0)
Protein, ur: NEGATIVE mg/dL
Specific Gravity, Urine: 1.02 (ref 1.005–1.030)
Urobilinogen, UA: 0.2 mg/dL (ref 0.0–1.0)

## 2014-09-22 MED ORDER — ONDANSETRON HCL 4 MG PO TABS: 4.0000 mg | ORAL_TABLET | Freq: Three times a day (TID) | ORAL | Status: DC | PRN

## 2014-09-22 MED ORDER — MORPHINE SULFATE 4 MG/ML IJ SOLN
4.0000 mg | Freq: Once | INTRAMUSCULAR | Status: AC
  Administered 2014-09-22: 4 mg via INTRAVENOUS
  Filled 2014-09-22: qty 1

## 2014-09-22 MED ORDER — ONDANSETRON HCL 4 MG/2ML IJ SOLN
4.0000 mg | Freq: Once | INTRAMUSCULAR | Status: AC
  Administered 2014-09-22: 4 mg via INTRAVENOUS
  Filled 2014-09-22: qty 2

## 2014-09-22 MED ORDER — HYDROCODONE-ACETAMINOPHEN 5-325 MG PO TABS: 1.0000 | ORAL_TABLET | Freq: Four times a day (QID) | ORAL | Status: DC | PRN

## 2014-09-22 MED ORDER — SODIUM CHLORIDE 0.9 % IV BOLUS (SEPSIS)
1000.0000 mL | Freq: Once | INTRAVENOUS | Status: AC
  Administered 2014-09-22: 1000 mL via INTRAVENOUS

## 2014-09-22 NOTE — ED Provider Notes (Signed)
CSN: [id number]     Arrival date & time 09/22/14  [id number] History   First MD Initiated Contact with Patient 09/22/14 0749     Chief Complaint  Patient presents with  . Flank Pain     (Consider location/radiation/quality/duration/timing/severity/associated sxs/prior Treatment) HPI  This a 43 year old male with a history of COPD, ADD who presents with left flank pain. Patient reports onset of symptoms this morning at 2:30 AM. He reports he is having sharp left flank pain that radiates into his left lower quadrant. The pain is constant. He took Advil at home without relief. Denies any hematuria or dysuria. No known history of kidney stones. Never had pain like this before. Denies any injury. Denies any fever, nausea, vomiting, diarrhea.  Past Medical History  Diagnosis Date  . COPD (chronic obstructive pulmonary disease)   . Arthritis   . Seizures     only once 12/30/2012  . Depression   . Bipolar disorder   . ADD (attention deficit disorder)   . Chronic back pain    Past Surgical History  Procedure Laterality Date  . Mandible fracture surgery  2014  . Appendectomy  2014   Family History  Problem Relation Age of Onset  . Diabetes Mother    History  Substance Use Topics  . Smoking status: Current Every Day Smoker    Types: Cigarettes  . Smokeless tobacco: Never Used  . Alcohol Use: No    Review of Systems  Constitutional: Negative.  Negative for fever.  Respiratory: Negative.  Negative for chest tightness and shortness of breath.   Cardiovascular: Negative.  Negative for chest pain.  Gastrointestinal: Negative.  Negative for nausea, vomiting, abdominal pain and diarrhea.  Genitourinary: Positive for flank pain. Negative for dysuria and hematuria.  Musculoskeletal: Positive for back pain.  Neurological: Negative for headaches.  All other systems reviewed and are negative.     Allergies  Aspirin and Ketorolac tromethamine  Home Medications   Prior to Admission  medications   Medication Sig Start Date End Date Taking? Authorizing Provider  b complex vitamins tablet Take 1 tablet by mouth daily.    Historical Provider, MD  gabapentin (NEURONTIN) 400 MG capsule Take 1 capsule (400 mg total) by mouth 3 (three) times daily. 08/27/14   Cleotis NipperSyed T Arfeen, MD  HYDROcodone-acetaminophen (NORCO/VICODIN) 5-325 MG per tablet Take 1 tablet by mouth every 6 (six) hours as needed for moderate pain. 09/22/14   Shon Batonourtney F Horton, MD  ibuprofen (ADVIL,MOTRIN) 200 MG tablet Take 800 mg by mouth daily as needed for moderate pain.    Historical Provider, MD  lamoTRIgine (LAMICTAL) 100 MG tablet Take 1 tablet (100 mg total) by mouth daily. 08/27/14 08/27/15  Cleotis NipperSyed T Arfeen, MD  metaxalone (SKELAXIN) 800 MG tablet Take 1 tablet (800 mg total) by mouth 3 (three) times daily. 08/31/14   Linna HoffJames D Kindl, MD  methylphenidate (CONCERTA) 36 MG PO CR tablet Take 1 tablet (36 mg total) by mouth daily. 08/27/14 08/27/15  Cleotis NipperSyed T Arfeen, MD  mirtazapine (REMERON) 15 MG tablet Take 1 tablet (15 mg total) by mouth at bedtime. 08/27/14 08/27/15  Cleotis NipperSyed T Arfeen, MD  ondansetron (ZOFRAN) 4 MG tablet Take 1 tablet (4 mg total) by mouth every 8 (eight) hours as needed for nausea or vomiting. 09/22/14   Shon Batonourtney F Horton, MD  predniSONE (DELTASONE) 20 MG tablet Take 3 tablets (60 mg total) by mouth daily with breakfast. 09/08/14   Gilda Creasehristopher J. Pollina, MD   BP 116/64 mmHg  Pulse 58  Temp(Src) 97.8 F (36.6 C) (Oral)  Resp 20  Ht 6\' 2"  (1.88 m)  Wt 198 lb (89.812 kg)  BMI 25.41 kg/m2  SpO2 100% Physical Exam  Constitutional: He is oriented to person, place, and time.  Uncomfortable appearing  HENT:  Head: Normocephalic and atraumatic.  Poor dentition  Cardiovascular: Normal rate, regular rhythm and normal heart sounds.   No murmur heard. Pulmonary/Chest: Effort normal and breath sounds normal. No respiratory distress. He has no wheezes.  Abdominal: Soft. Bowel sounds are normal. There is no  tenderness. There is no rebound and no guarding.  Genitourinary:  No CVA tenderness  Musculoskeletal: He exhibits no edema.  Neurological: He is alert and oriented to person, place, and time.  Skin: Skin is warm and dry.  Psychiatric: He has a normal mood and affect.  Nursing note and vitals reviewed.   ED Course  Procedures (including critical care time) Labs Review Labs Reviewed  BASIC METABOLIC PANEL - Abnormal; Notable for the following:    Glucose, Bld 112 (*)    GFR calc non Af Amer 82 (*)    All other components within normal limits  URINALYSIS, ROUTINE W REFLEX MICROSCOPIC    Imaging Review Ct Renal Stone Study  09/22/2014   CLINICAL DATA:  Left flank pain radiating to left groin 6 hr. Previous appendectomy.  EXAM: CT ABDOMEN AND PELVIS WITHOUT CONTRAST  TECHNIQUE: Multidetector CT imaging of the abdomen and pelvis was performed following the standard protocol without IV contrast.  COMPARISON:  03/27/2014  FINDINGS: Lung bases are within normal.  Abdominal images demonstrate a couple sub cm hypodensities over the liver likely cysts or hemangiomas. The spleen, pancreas, gallbladder and adrenal glands are normal. Kidneys normal in size without hydronephrosis or nephrolithiasis. Ureters are normal. There are surgical clips over the right lower quadrant compatible with previous appendectomy. There is no free fluid or focal inflammatory change. Vascular structures are unremarkable.  Several mildly prominent small-bowel loops with mild wall thickening in the left mid to upper abdomen.  Pelvic images demonstrate the bladder, rectum and prostate to be within normal. There is no free fluid. Left inguinal region is within normal. Remaining bones and soft tissues are within normal.  IMPRESSION: Mild prominence of several small bowel loops in the left mid to upper abdomen with suggestion of minimal wall thickening. Findings are nonspecific, but may be due to a regional enteritis of infectious or  inflammatory nature.  Two subcentimeter liver hypodensities too small to characterize but likely cysts.   Electronically Signed   By: Elberta Fortisaniel  Boyle M.D.   On: 09/22/2014 08:51     EKG Interpretation None      MDM   Final diagnoses:  Flank pain    Patient presents with left-sided flank pain that radiates into his left lower quadrant. Uncomfortable appearing but nontoxic on exam. Vital signs reassuring. Patient is afebrile. No associated vomiting or diarrhea. BMP and urinalysis obtained. Patient's history most suggestive of kidney stone. Patient given fluids, nausea, and pain medication.  Lab work is unremarkable. CT scan is without evidence of stones but does show mild prominence of small bowel loops. This is of unclear significance given the patient does not have any diarrhea or vomiting or enteritis symptoms. On recheck, patient continues to be nontoxic and abdominal exam is benign. Discussed with patient that at this time I was unsure of the cause of his pain. Will discharge with pain and nausea medication. Patient was given strict discharge instructions  including worsening of pain, development of fevers, vomiting diarrhea, or any other new or worsening symptoms.  After history, exam, and medical workup I feel the patient has been appropriately medically screened and is safe for discharge home. Pertinent diagnoses were discussed with the patient. Patient was given return precautions.     Shon Baton, MD 09/22/14 [phone]

## 2014-09-22 NOTE — ED Notes (Signed)
Patient c/o L flank pain that extends to groin

## 2014-09-22 NOTE — Discharge Instructions (Signed)
Flank Pain °Flank pain refers to pain that is located on the side of the body between the upper abdomen and the back. The pain may occur over a short period of time (acute) or may be long-term or reoccurring (chronic). It may be mild or severe. Flank pain can be caused by many things. °CAUSES  °Some of the more common causes of flank pain include: °· Muscle strains.   °· Muscle spasms.   °· A disease of your spine (vertebral disk disease).   °· A lung infection (pneumonia).   °· Fluid around your lungs (pulmonary edema).   °· A kidney infection.   °· Kidney stones.   °· A very painful skin rash caused by the chickenpox virus (shingles).   °· Gallbladder disease.   °HOME CARE INSTRUCTIONS  °Home care will depend on the cause of your pain. In general, °· Rest as directed by your caregiver. °· Drink enough fluids to keep your urine clear or pale yellow. °· Only take over-the-counter or prescription medicines as directed by your caregiver. Some medicines may help relieve the pain. °· Tell your caregiver about any changes in your pain. °· Follow up with your caregiver as directed. °SEEK IMMEDIATE MEDICAL CARE IF:  °· Your pain is not controlled with medicine.   °· You have new or worsening symptoms. °· Your pain increases.   °· You have abdominal pain.   °· You have shortness of breath.   °· You have persistent nausea or vomiting.   °· You have swelling in your abdomen.   °· You feel faint or pass out.   °· You have blood in your urine. °· You have a fever or persistent symptoms for more than 2-3 days. °· You have a fever and your symptoms suddenly get worse. °MAKE SURE YOU:  °· Understand these instructions. °· Will watch your condition. °· Will get help right away if you are not doing well or get worse. °Document Released: 12/09/2005 Document Revised: 07/12/2012 Document Reviewed: 06/01/2012 °Exit[employer]® Patient Information ©2015 [employer]. This information is not intended to replace advice given to you by your  health care provider. Make sure you discuss any questions you have with your health care provider. ° °

## 2014-09-22 NOTE — ED Notes (Signed)
Patient drinking sprite.  

## 2014-09-24 ENCOUNTER — Ambulatory Visit (HOSPITAL_COMMUNITY): Payer: Self-pay | Admitting: Psychiatry

## 2014-09-27 ENCOUNTER — Encounter (HOSPITAL_BASED_OUTPATIENT_CLINIC_OR_DEPARTMENT_OTHER): Payer: Self-pay

## 2014-09-27 ENCOUNTER — Emergency Department (HOSPITAL_BASED_OUTPATIENT_CLINIC_OR_DEPARTMENT_OTHER)
Admission: EM | Admit: 2014-09-27 | Discharge: 2014-09-27 | Disposition: A | Discharge status: Home / Self Care | Payer: BC Managed Care – PPO | Attending: Emergency Medicine | Admitting: Emergency Medicine

## 2014-09-27 DIAGNOSIS — X58XXXA Exposure to other specified factors, initial encounter: Secondary | ICD-10-CM | POA: Diagnosis not present

## 2014-09-27 DIAGNOSIS — S299XXA Unspecified injury of thorax, initial encounter: Secondary | ICD-10-CM | POA: Diagnosis present

## 2014-09-27 DIAGNOSIS — Y9289 Other specified places as the place of occurrence of the external cause: Secondary | ICD-10-CM | POA: Diagnosis not present

## 2014-09-27 DIAGNOSIS — Y9389 Activity, other specified: Secondary | ICD-10-CM | POA: Diagnosis not present

## 2014-09-27 DIAGNOSIS — Y998 Other external cause status: Secondary | ICD-10-CM | POA: Insufficient documentation

## 2014-09-27 NOTE — ED Notes (Signed)
Pt reports was lifting a tv out of a truck and felt a pop in his back last night.  Pt seen twice this month for similar complaints.

## 2014-09-27 NOTE — ED Notes (Signed)
Pt left prior to being seen by physician, he came out of room, verbally upset, I explained to patient that physician is working as fast as she can and that he will be soon as soon as possible, based on acquity. Pt was screaming and verbally upset and was assisted out of department by this RN to front desk. Pt refused to sign

## 2014-10-12 ENCOUNTER — Encounter (HOSPITAL_BASED_OUTPATIENT_CLINIC_OR_DEPARTMENT_OTHER): Payer: Self-pay | Admitting: *Deleted

## 2014-10-12 ENCOUNTER — Emergency Department (HOSPITAL_BASED_OUTPATIENT_CLINIC_OR_DEPARTMENT_OTHER)
Admission: EM | Admit: 2014-10-12 | Discharge: 2014-10-12 | Disposition: A | Discharge status: Home / Self Care | Payer: BC Managed Care – PPO | Attending: Emergency Medicine | Admitting: Emergency Medicine

## 2014-10-12 DIAGNOSIS — G8929 Other chronic pain: Secondary | ICD-10-CM | POA: Diagnosis not present

## 2014-10-12 DIAGNOSIS — M1990 Unspecified osteoarthritis, unspecified site: Secondary | ICD-10-CM | POA: Diagnosis not present

## 2014-10-12 DIAGNOSIS — J449 Chronic obstructive pulmonary disease, unspecified: Secondary | ICD-10-CM | POA: Insufficient documentation

## 2014-10-12 DIAGNOSIS — M545 Low back pain: Secondary | ICD-10-CM | POA: Diagnosis present

## 2014-10-12 DIAGNOSIS — Z7952 Long term (current) use of systemic steroids: Secondary | ICD-10-CM | POA: Diagnosis not present

## 2014-10-12 DIAGNOSIS — G40909 Epilepsy, unspecified, not intractable, without status epilepticus: Secondary | ICD-10-CM | POA: Insufficient documentation

## 2014-10-12 DIAGNOSIS — Z79899 Other long term (current) drug therapy: Secondary | ICD-10-CM | POA: Diagnosis not present

## 2014-10-12 DIAGNOSIS — F319 Bipolar disorder, unspecified: Secondary | ICD-10-CM | POA: Insufficient documentation

## 2014-10-12 DIAGNOSIS — Z720 Tobacco use: Secondary | ICD-10-CM | POA: Diagnosis not present

## 2014-10-12 DIAGNOSIS — F909 Attention-deficit hyperactivity disorder, unspecified type: Secondary | ICD-10-CM | POA: Diagnosis not present

## 2014-10-12 MED ORDER — OXYCODONE-ACETAMINOPHEN 5-325 MG PO TABS
2.0000 | ORAL_TABLET | Freq: Once | ORAL | Status: AC
  Administered 2014-10-12: 2 via ORAL
  Filled 2014-10-12: qty 2

## 2014-10-12 MED ORDER — METHOCARBAMOL 500 MG PO TABS: 500.0000 mg | ORAL_TABLET | Freq: Three times a day (TID) | ORAL | Status: DC | PRN

## 2014-10-12 MED ORDER — OXYCODONE-ACETAMINOPHEN 5-325 MG PO TABS: 2.0000 | ORAL_TABLET | ORAL | Status: DC | PRN

## 2014-10-12 NOTE — Discharge Instructions (Signed)
Please contact a primary care physician to manage your ongoing symptoms. Orthopedic rererral has been given for evaluation for your back pain. Avoid heavy lifting--Lifting furniture seems to put you into the emergency room. Return with any loss of bladder control, or loss of use or feeling of your leg or legs.   Back Pain, Adult Low back pain is very common. About 1 in 5 people have back pain.The cause of low back pain is rarely dangerous. The pain often gets better over time.About half of people with a sudden onset of back pain feel better in just 2 weeks. About 8 in 10 people feel better by 6 weeks.  CAUSES Some common causes of back pain include:  Strain of the muscles or ligaments supporting the spine.  Wear and tear (degeneration) of the spinal discs.  Arthritis.  Direct injury to the back. DIAGNOSIS Most of the time, the direct cause of low back pain is not known.However, back pain can be treated effectively even when the exact cause of the pain is unknown.Answering your caregiver's questions about your overall health and symptoms is one of the most accurate ways to make sure the cause of your pain is not dangerous. If your caregiver needs more information, he or she may order lab work or imaging tests (X-rays or MRIs).However, even if imaging tests show changes in your back, this usually does not require surgery. HOME CARE INSTRUCTIONS For many people, back pain returns.Since low back pain is rarely dangerous, it is often a condition that people can learn to [facility] their own.   Remain active. It is stressful on the back to sit or stand in one place. Do not sit, drive, or stand in one place for more than 30 minutes at a time. Take short walks on level surfaces as soon as pain allows.Try to increase the length of time you walk each day.  Do not stay in bed.Resting more than 1 or 2 days can delay your recovery.  Do not avoid exercise or work.Your body is made to move.It is  not dangerous to be active, even though your back may hurt.Your back will likely heal faster if you return to being active before your pain is gone.  Pay attention to your body when you bend and lift. Many people have less discomfortwhen lifting if they bend their knees, keep the load close to their bodies,and avoid twisting. Often, the most comfortable positions are those that put less stress on your recovering back.  Find a comfortable position to sleep. Use a firm mattress and lie on your side with your knees slightly bent. If you lie on your back, put a pillow under your knees.  Only take over-the-counter or prescription medicines as directed by your caregiver. Over-the-counter medicines to reduce pain and inflammation are often the most helpful.Your caregiver may prescribe muscle relaxant drugs.These medicines help dull your pain so you can more quickly return to your normal activities and healthy exercise.  Put ice on the injured area.  Put ice in a plastic bag.  Place a towel between your skin and the bag.  Leave the ice on for 15-20 minutes, 03-04 times a day for the first 2 to 3 days. After that, ice and heat may be alternated to reduce pain and spasms.  Ask your caregiver about trying back exercises and gentle massage. This may be of some benefit.  Avoid feeling anxious or stressed.Stress increases muscle tension and can worsen back pain.It is important to recognize when you  are anxious or stressed and learn ways to manage it.Exercise is a great option. SEEK MEDICAL CARE IF:  You have pain that is not relieved with rest or medicine.  You have pain that does not improve in 1 week.  You have new symptoms.  You are generally not feeling well. SEEK IMMEDIATE MEDICAL CARE IF:   You have pain that radiates from your back into your legs.  You develop new bowel or bladder control problems.  You have unusual weakness or numbness in your arms or legs.  You develop nausea  or vomiting.  You develop abdominal pain.  You feel faint. Document Released: 10/18/2005 Document Revised: 04/18/2012 Document Reviewed: 02/19/2014 [facility] Patient Information 2015 [employer] [place]. This information is not intended to replace advice given to you by your health care provider. Make sure you discuss any questions you have with your health care provider.

## 2014-10-12 NOTE — ED Provider Notes (Signed)
CSN: [id number]     Arrival date & time 10/12/14  1208 History   First MD Initiated Contact with Patient 10/12/14 1406     Chief Complaint  Patient presents with  . Back Pain     HPI  She presents for evaluation of back pain. States that he was "moving furniture" yesterday. States it was some off of the [employer]. States abdomen is washer and dryer. States his wife is unable to help and stress to this alone. He has recent ER visits for multiple different pain syndromes. Has had 15 visits in the last 6 months.  Few of which have been "moving furniture". He describes his pain as midline low back without radiation today. Occasionally has pain in his hips. Not today. No bladder symptoms. No loss of use or strength lower extremities. No radiation of pain to the legs. No tingling or numbness. No bowel or bladder symptoms.  Past Medical History  Diagnosis Date  . COPD (chronic obstructive pulmonary disease)   . Arthritis   . Seizures     only once 12/30/2012  . Depression   . Bipolar disorder   . ADD (attention deficit disorder)   . Chronic back pain    Past Surgical History  Procedure Laterality Date  . Mandible fracture surgery  2014  . Appendectomy  2014   Family History  Problem Relation Age of Onset  . Diabetes Mother    History  Substance Use Topics  . Smoking status: Current Every Day Smoker    Types: Cigarettes  . Smokeless tobacco: Never Used  . Alcohol Use: No    Review of Systems  Constitutional: Negative for fever, chills, diaphoresis, appetite change and fatigue.  HENT: Negative for mouth sores, sore throat and trouble swallowing.   Eyes: Negative for visual disturbance.  Respiratory: Negative for cough, chest tightness, shortness of breath and wheezing.   Cardiovascular: Negative for chest pain.  Gastrointestinal: Negative for nausea, vomiting, abdominal pain, diarrhea and abdominal distention.  Endocrine: Negative for polydipsia, polyphagia and polyuria.    Genitourinary: Negative for dysuria, frequency and hematuria.  Musculoskeletal: Positive for back pain. Negative for gait problem.       Back pain  Skin: Negative for color change, pallor and rash.  Neurological: Negative for dizziness, syncope, light-headedness and headaches.  Hematological: Does not bruise/bleed easily.  Psychiatric/Behavioral: Negative for behavioral problems and confusion.      Allergies  Aspirin; Ketorolac tromethamine; and Tramadol  Home Medications   Prior to Admission medications   Medication Sig Start Date End Date Taking? Authorizing Provider  b complex vitamins tablet Take 1 tablet by mouth daily.    Historical Provider, MD  gabapentin (NEURONTIN) 400 MG capsule Take 1 capsule (400 mg total) by mouth 3 (three) times daily. 08/27/14   Cleotis NipperSyed T Arfeen, MD  HYDROcodone-acetaminophen (NORCO/VICODIN) 5-325 MG per tablet Take 1 tablet by mouth every 6 (six) hours as needed for moderate pain. 09/22/14   Shon Batonourtney F Horton, MD  ibuprofen (ADVIL,MOTRIN) 200 MG tablet Take 800 mg by mouth daily as needed for moderate pain.    Historical Provider, MD  lamoTRIgine (LAMICTAL) 100 MG tablet Take 1 tablet (100 mg total) by mouth daily. 08/27/14 08/27/15  Cleotis NipperSyed T Arfeen, MD  metaxalone (SKELAXIN) 800 MG tablet Take 1 tablet (800 mg total) by mouth 3 (three) times daily. 08/31/14   Linna HoffJames D Kindl, MD  methocarbamol (ROBAXIN) 500 MG tablet Take 1 tablet (500 mg total) by mouth 3 (three) times daily between  meals as needed. 10/12/14   Rolland PorterMark Boruch Manuele, MD  methylphenidate (CONCERTA) 36 MG PO CR tablet Take 1 tablet (36 mg total) by mouth daily. 08/27/14 08/27/15  Cleotis NipperSyed T Arfeen, MD  mirtazapine (REMERON) 15 MG tablet Take 1 tablet (15 mg total) by mouth at bedtime. 08/27/14 08/27/15  Cleotis NipperSyed T Arfeen, MD  ondansetron (ZOFRAN) 4 MG tablet Take 1 tablet (4 mg total) by mouth every 8 (eight) hours as needed for nausea or vomiting. 09/22/14   Shon Batonourtney F Horton, MD  oxyCODONE-acetaminophen  (PERCOCET/ROXICET) 5-325 MG per tablet Take 2 tablets by mouth every 4 (four) hours as needed. 10/12/14   Rolland PorterMark Deanthony Maull, MD  predniSONE (DELTASONE) 20 MG tablet Take 3 tablets (60 mg total) by mouth daily with breakfast. 09/08/14   Gilda Creasehristopher J. Pollina, MD   BP 118/67 mmHg  Pulse 77  Temp(Src) 98.3 F (36.8 C) (Oral)  Resp 18  SpO2 99% Physical Exam  Constitutional: He is oriented to person, place, and time. He appears well-developed and well-nourished. No distress.  HENT:  Head: Normocephalic.  Eyes: Conjunctivae are normal. Pupils are equal, round, and reactive to light. No scleral icterus.  Neck: Normal range of motion. Neck supple. No thyromegaly present.  Cardiovascular: Normal rate and regular rhythm.  Exam reveals no gallop and no friction rub.   No murmur heard. Pulmonary/Chest: Effort normal and breath sounds normal. No respiratory distress. He has no wheezes. He has no rales.  Abdominal: Soft. Bowel sounds are normal. He exhibits no distension. There is no tenderness. There is no rebound.  Musculoskeletal: Normal range of motion.       Back:  Tenderness in the midline. No paraspinal muscular tenderness.  Neurological: He is alert and oriented to person, place, and time.  Normal symmetric strength to flex/.extend hip and knees, dorsi/plantar flex ankles. Normal symmetric sensation to all distributions to LEs Patellar and achilles reflexes 1-2+. Downgoing Babinski   Skin: Skin is warm and dry. No rash noted.  Psychiatric: He has a normal mood and affect. His behavior is normal.    ED Course  Procedures (including critical care time) Labs Review Labs Reviewed - No data to display  Imaging Review No results found.   EKG Interpretation None      MDM   Final diagnoses:  Low back pain without sciatica, unspecified back pain laterality    Had a frank discussion with the patient about his ER use. I discussed with him that he would need to find primary care regarding  his ongoing pain needs. He is given orthopedic referral. He is insured. States he has [employer] and [employer]. No reason that he couldn't follow with primary care physician. Told him that we would be less likely to continue to treat his ongoing back symptoms in the emergency room. He expressed an understanding of this.    Rolland PorterMark Fransisco Messmer, MD 10/12/14 [id number]

## 2014-10-12 NOTE — ED Notes (Signed)
Patient states he is having lower back and leg pain.

## 2014-10-15 ENCOUNTER — Other Ambulatory Visit (HOSPITAL_COMMUNITY): Payer: Self-pay | Admitting: Psychiatry

## 2014-10-16 ENCOUNTER — Other Ambulatory Visit (HOSPITAL_COMMUNITY): Payer: Self-pay | Admitting: Psychiatry

## 2014-11-04 ENCOUNTER — Encounter (HOSPITAL_BASED_OUTPATIENT_CLINIC_OR_DEPARTMENT_OTHER): Payer: Self-pay

## 2014-11-04 ENCOUNTER — Emergency Department (HOSPITAL_BASED_OUTPATIENT_CLINIC_OR_DEPARTMENT_OTHER)
Admission: EM | Admit: 2014-11-04 | Discharge: 2014-11-04 | Disposition: A | Discharge status: Home / Self Care | Payer: Medicaid Other | Attending: Emergency Medicine | Admitting: Emergency Medicine

## 2014-11-04 DIAGNOSIS — F319 Bipolar disorder, unspecified: Secondary | ICD-10-CM | POA: Insufficient documentation

## 2014-11-04 DIAGNOSIS — G40909 Epilepsy, unspecified, not intractable, without status epilepticus: Secondary | ICD-10-CM | POA: Insufficient documentation

## 2014-11-04 DIAGNOSIS — M1990 Unspecified osteoarthritis, unspecified site: Secondary | ICD-10-CM | POA: Insufficient documentation

## 2014-11-04 DIAGNOSIS — Z7952 Long term (current) use of systemic steroids: Secondary | ICD-10-CM | POA: Insufficient documentation

## 2014-11-04 DIAGNOSIS — M545 Low back pain: Secondary | ICD-10-CM

## 2014-11-04 DIAGNOSIS — J449 Chronic obstructive pulmonary disease, unspecified: Secondary | ICD-10-CM | POA: Insufficient documentation

## 2014-11-04 DIAGNOSIS — G8929 Other chronic pain: Secondary | ICD-10-CM | POA: Insufficient documentation

## 2014-11-04 DIAGNOSIS — Z79899 Other long term (current) drug therapy: Secondary | ICD-10-CM | POA: Insufficient documentation

## 2014-11-04 DIAGNOSIS — Z720 Tobacco use: Secondary | ICD-10-CM | POA: Insufficient documentation

## 2014-11-04 DIAGNOSIS — M5441 Lumbago with sciatica, right side: Secondary | ICD-10-CM | POA: Insufficient documentation

## 2014-11-04 MED ORDER — IBUPROFEN 800 MG PO TABS: 800.0000 mg | ORAL_TABLET | Freq: Once | ORAL | Status: DC

## 2014-11-04 NOTE — ED Notes (Signed)
MD at bedside. 

## 2014-11-04 NOTE — ED Notes (Signed)
Pt. Is not in room at present time.

## 2014-11-04 NOTE — ED Provider Notes (Signed)
CSN: [id number]     Arrival date & time 11/04/14  1212 History  This chart was scribed for Glynn Octave, MD by Leone Payor, ED Scribe. This patient was seen in room MH06/MH06 and the patient's care was started 1:13 PM.    Chief Complaint  Patient presents with  . Back Pain   The history is provided by the patient. No language interpreter was used.     HPI Comments: [name] is a 44 y.o. male who presents to the Emergency Department complaining of a new episode of chronic lower back pain that has been constant and gradually worsened over the last several days. He reports moving furniture prior to onset of symptoms. Patient was last seen for similar symptoms on 10/12/14. Patient states he takes methadone regularly which is prescribed by Doylene Bode. He denies fever, nausea, vomiting, change in bowel or bladder function, numbness, weakness, dysuria, hematuria, SOB. He denies history of CA or IV drug use.   Doylene Bode at [facility]  Past Medical History  Diagnosis Date  . COPD (chronic obstructive pulmonary disease)   . Arthritis   . Seizures     only once 12/30/2012  . Depression   . Bipolar disorder   . ADD (attention deficit disorder)   . Chronic back pain    Past Surgical History  Procedure Laterality Date  . Mandible fracture surgery  2014  . Appendectomy  2014   Family History  Problem Relation Age of Onset  . Diabetes Mother    History  Substance Use Topics  . Smoking status: Current Every Day Smoker    Types: Cigarettes  . Smokeless tobacco: Never Used  . Alcohol Use: No    Review of Systems  A complete 10 system review of systems was obtained and all systems are negative except as noted in the HPI and PMH.    Allergies  Aspirin; Ketorolac tromethamine; and Tramadol  Home Medications   Prior to Admission medications   Medication Sig Start Date End Date Taking? Authorizing Provider  methadone (DOLOPHINE) 10 MG tablet Take 10 mg by mouth every  8 (eight) hours.   Yes Historical Provider, MD  b complex vitamins tablet Take 1 tablet by mouth daily.    Historical Provider, MD  gabapentin (NEURONTIN) 400 MG capsule Take 1 capsule (400 mg total) by mouth 3 (three) times daily. 08/27/14   Cleotis Nipper, MD  HYDROcodone-acetaminophen (NORCO/VICODIN) 5-325 MG per tablet Take 1 tablet by mouth every 6 (six) hours as needed for moderate pain. 09/22/14   Shon Baton, MD  ibuprofen (ADVIL,MOTRIN) 200 MG tablet Take 800 mg by mouth daily as needed for moderate pain.    Historical Provider, MD  lamoTRIgine (LAMICTAL) 100 MG tablet Take 1 tablet (100 mg total) by mouth daily. 08/27/14 08/27/15  Cleotis Nipper, MD  metaxalone (SKELAXIN) 800 MG tablet Take 1 tablet (800 mg total) by mouth 3 (three) times daily. 08/31/14   Linna Hoff, MD  methocarbamol (ROBAXIN) 500 MG tablet Take 1 tablet (500 mg total) by mouth 3 (three) times daily between meals as needed. 10/12/14   Rolland Porter, MD  methylphenidate (CONCERTA) 36 MG PO CR tablet Take 1 tablet (36 mg total) by mouth daily. 08/27/14 08/27/15  Cleotis Nipper, MD  mirtazapine (REMERON) 15 MG tablet Take 1 tablet (15 mg total) by mouth at bedtime. 08/27/14 08/27/15  Cleotis Nipper, MD  ondansetron (ZOFRAN) 4 MG tablet Take 1 tablet (4  mg total) by mouth every 8 (eight) hours as needed for nausea or vomiting. 09/22/14   Shon Baton, MD  oxyCODONE-acetaminophen (PERCOCET/ROXICET) 5-325 MG per tablet Take 2 tablets by mouth every 4 (four) hours as needed. 10/12/14   Rolland Porter, MD  predniSONE (DELTASONE) 20 MG tablet Take 3 tablets (60 mg total) by mouth daily with breakfast. 09/08/14   Gilda Crease, MD   BP 119/74 mmHg  Pulse 92  Temp(Src) 98.2 F (36.8 C) (Oral)  Resp 20  Ht 6\' 1"  (1.854 m)  Wt 202 lb (91.627 kg)  BMI 26.66 kg/m2  SpO2 100% Physical Exam  Constitutional: He is oriented to person, place, and time. He appears well-developed and well-nourished. No distress.  HENT:   Head: Normocephalic and atraumatic.  Mouth/Throat: Oropharynx is clear and moist. No oropharyngeal exudate.  Eyes: Conjunctivae and EOM are normal. Pupils are equal, round, and reactive to light.  Neck: Normal range of motion. Neck supple.  No meningismus.  Cardiovascular: Normal rate, regular rhythm, normal heart sounds and intact distal pulses.   No murmur heard. Pulmonary/Chest: Effort normal and breath sounds normal. No respiratory distress.  Abdominal: Soft. There is no tenderness. There is no rebound and no guarding.  Musculoskeletal: Normal range of motion. He exhibits tenderness. He exhibits no edema.  Right paraspinal lower thoracic and upper lumbar tenderness. 5/5 strength in bilateral lower extremities. Ankle plantar and dorsiflexion intact. Great toe extension intact bilaterally. +2 DP and PT pulses. +2 patellar reflexes bilaterally. Normal gait.   Neurological: He is alert and oriented to person, place, and time. No cranial nerve deficit. He exhibits normal muscle tone. Coordination normal.  No ataxia on finger to nose bilaterally. No pronator drift. 5/5 strength throughout. CN 2-12 intact. Negative Romberg. Equal grip strength. Sensation intact. Gait is normal.   Skin: Skin is warm.  Psychiatric: He has a normal mood and affect. His behavior is normal.  Nursing note and vitals reviewed.   ED Course  Procedures (including critical care time)  DIAGNOSTIC STUDIES: Oxygen Saturation is 100% on RA, normal by my interpretation.    COORDINATION OF CARE: 1:21 PM Discussed treatment plan with pt at bedside and pt agreed to plan.   Labs Review Labs Reviewed  BASIC METABOLIC PANEL  D-DIMER, QUANTITATIVE  URINALYSIS, ROUTINE W REFLEX MICROSCOPIC    Imaging Review No results found.   EKG Interpretation None      MDM   Final diagnoses:  Right low back pain, with sciatica presence unspecified   Patient presents with right sided lumbar and thoracic back pain onset after  lifting heavy furniture 2 weeks ago. States he lifted furniture again over [employer] and his pain is worse. He is on chronic methadone for back pain. No incontinence, fever, vomiting, focal weakness, numbness or tingling.  Neurovascularly intact with intact distal pulses  Patient reports pain is higher up on his spine than usual and is more towards the lower thoracic and lumbar area. Plan to obtain labs with D-dimer given pleuritic nature of pain. No hypoxia or tachycardia.  Patient drove himself and will not receive narcotic pain medications. Frequent visits in the ED for back pain.  Ibuprofen was offered for patient's back pain since he drove himself, and on reevaluation he was noted to have left the department without notifying staff.  I personally performed the services described in this documentation, which was scribed in my presence. The recorded information has been reviewed and is accurate.    Glynn Octave,  MD 11/04/14 1542

## 2014-11-04 NOTE — ED Notes (Signed)
Pt reports 2 weeks ago was lifting heavy furniture and has had back pain since this time.  Several days ago again lifted furniture and pain worsening, pain radiating down L leg.  Denies n/t or loss of bowel bladder.

## 2014-12-04 ENCOUNTER — Encounter (HOSPITAL_BASED_OUTPATIENT_CLINIC_OR_DEPARTMENT_OTHER): Payer: Self-pay | Admitting: Emergency Medicine

## 2014-12-04 ENCOUNTER — Emergency Department (HOSPITAL_BASED_OUTPATIENT_CLINIC_OR_DEPARTMENT_OTHER)
Admission: EM | Admit: 2014-12-04 | Discharge: 2014-12-04 | Discharge status: Against Medical Advice | Payer: Medicaid Other | Attending: Emergency Medicine | Admitting: Emergency Medicine

## 2014-12-04 ENCOUNTER — Emergency Department (HOSPITAL_BASED_OUTPATIENT_CLINIC_OR_DEPARTMENT_OTHER): Payer: Medicaid Other

## 2014-12-04 DIAGNOSIS — M1990 Unspecified osteoarthritis, unspecified site: Secondary | ICD-10-CM | POA: Insufficient documentation

## 2014-12-04 DIAGNOSIS — Z720 Tobacco use: Secondary | ICD-10-CM | POA: Insufficient documentation

## 2014-12-04 DIAGNOSIS — G8929 Other chronic pain: Secondary | ICD-10-CM

## 2014-12-04 DIAGNOSIS — Y9301 Activity, walking, marching and hiking: Secondary | ICD-10-CM | POA: Insufficient documentation

## 2014-12-04 DIAGNOSIS — Y9289 Other specified places as the place of occurrence of the external cause: Secondary | ICD-10-CM | POA: Insufficient documentation

## 2014-12-04 DIAGNOSIS — Z7952 Long term (current) use of systemic steroids: Secondary | ICD-10-CM | POA: Insufficient documentation

## 2014-12-04 DIAGNOSIS — W1839XA Other fall on same level, initial encounter: Secondary | ICD-10-CM | POA: Insufficient documentation

## 2014-12-04 DIAGNOSIS — Y998 Other external cause status: Secondary | ICD-10-CM | POA: Insufficient documentation

## 2014-12-04 DIAGNOSIS — S3992XA Unspecified injury of lower back, initial encounter: Secondary | ICD-10-CM | POA: Insufficient documentation

## 2014-12-04 DIAGNOSIS — F319 Bipolar disorder, unspecified: Secondary | ICD-10-CM | POA: Insufficient documentation

## 2014-12-04 DIAGNOSIS — F909 Attention-deficit hyperactivity disorder, unspecified type: Secondary | ICD-10-CM | POA: Insufficient documentation

## 2014-12-04 DIAGNOSIS — Z79899 Other long term (current) drug therapy: Secondary | ICD-10-CM | POA: Insufficient documentation

## 2014-12-04 DIAGNOSIS — G40909 Epilepsy, unspecified, not intractable, without status epilepticus: Secondary | ICD-10-CM | POA: Insufficient documentation

## 2014-12-04 DIAGNOSIS — M549 Dorsalgia, unspecified: Secondary | ICD-10-CM

## 2014-12-04 DIAGNOSIS — J449 Chronic obstructive pulmonary disease, unspecified: Secondary | ICD-10-CM | POA: Insufficient documentation

## 2014-12-04 NOTE — ED Provider Notes (Signed)
CSN: [id number]     Arrival date & time 12/04/14  1847 History   First MD Initiated Contact with Patient 12/04/14 1851     Chief Complaint  Patient presents with  . Fall     (Consider location/radiation/quality/duration/timing/severity/associated sxs/prior Treatment) HPI Comments: Pt states that he fell walking up the hill yesterday and now he is having bilateral lower back pain and left leg pain. Denies loc. Has chronic problems but this is worse then normal. No fever. He states that he is not treated for chronic pain. Denies numbness, weakness or incontinence  Patient is a 44 y.o. male presenting with fall. The history is provided by the patient. No language interpreter was used.  Fall This is a new problem. The current episode started yesterday. The problem occurs constantly. The problem has been unchanged. The symptoms are aggravated by bending. He has tried nothing for the symptoms.    Past Medical History  Diagnosis Date  . COPD (chronic obstructive pulmonary disease)   . Arthritis   . Seizures     only once 12/30/2012  . Depression   . Bipolar disorder   . ADD (attention deficit disorder)   . Chronic back pain    Past Surgical History  Procedure Laterality Date  . Mandible fracture surgery  2014  . Appendectomy  2014   Family History  Problem Relation Age of Onset  . Diabetes Mother    History  Substance Use Topics  . Smoking status: Current Every Day Smoker -- 0.50 packs/day    Types: Cigarettes  . Smokeless tobacco: Never Used  . Alcohol Use: No    Review of Systems  All other systems reviewed and are negative.     Allergies  Aspirin; Ketorolac tromethamine; and Tramadol  Home Medications   Prior to Admission medications   Medication Sig Start Date End Date Taking? Authorizing Provider  b complex vitamins tablet Take 1 tablet by mouth daily.    Historical Provider, MD  gabapentin (NEURONTIN) 400 MG capsule Take 1 capsule (400 mg total) by mouth 3  (three) times daily. 08/27/14   Cleotis Nipper, MD  HYDROcodone-acetaminophen (NORCO/VICODIN) 5-325 MG per tablet Take 1 tablet by mouth every 6 (six) hours as needed for moderate pain. 09/22/14   Shon Baton, MD  ibuprofen (ADVIL,MOTRIN) 200 MG tablet Take 800 mg by mouth daily as needed for moderate pain.    Historical Provider, MD  lamoTRIgine (LAMICTAL) 100 MG tablet Take 1 tablet (100 mg total) by mouth daily. 08/27/14 08/27/15  Cleotis Nipper, MD  metaxalone (SKELAXIN) 800 MG tablet Take 1 tablet (800 mg total) by mouth 3 (three) times daily. 08/31/14   Linna Hoff, MD  methadone (DOLOPHINE) 10 MG tablet Take 10 mg by mouth every 8 (eight) hours.    Historical Provider, MD  methocarbamol (ROBAXIN) 500 MG tablet Take 1 tablet (500 mg total) by mouth 3 (three) times daily between meals as needed. 10/12/14   Rolland Porter, MD  methylphenidate (CONCERTA) 36 MG PO CR tablet Take 1 tablet (36 mg total) by mouth daily. 08/27/14 08/27/15  Cleotis Nipper, MD  mirtazapine (REMERON) 15 MG tablet Take 1 tablet (15 mg total) by mouth at bedtime. 08/27/14 08/27/15  Cleotis Nipper, MD  ondansetron (ZOFRAN) 4 MG tablet Take 1 tablet (4 mg total) by mouth every 8 (eight) hours as needed for nausea or vomiting. 09/22/14   Shon Baton, MD  oxyCODONE-acetaminophen (PERCOCET/ROXICET) 5-325 MG per tablet Take 2 tablets  by mouth every 4 (four) hours as needed. 10/12/14   Rolland PorterMark James, MD  predniSONE (DELTASONE) 20 MG tablet Take 3 tablets (60 mg total) by mouth daily with breakfast. 09/08/14   Gilda Creasehristopher J. Pollina, MD   BP 147/73 mmHg  Pulse 85  Temp(Src) 98.3 F (36.8 C) (Oral)  Resp 16  Ht 6\' 2"  (1.88 m)  Wt 210 lb (95.255 kg)  BMI 26.95 kg/m2  SpO2 100% Physical Exam  Constitutional: He is oriented to person, place, and time. He appears well-developed and well-nourished.  Cardiovascular: Normal rate and regular rhythm.   Pulmonary/Chest: Effort normal and breath sounds normal.  Musculoskeletal:  Normal range of motion.       Cervical back: Normal.       Thoracic back: Normal.       Lumbar back: He exhibits tenderness and bony tenderness. He exhibits normal range of motion.  Neurological: He is alert and oriented to person, place, and time. He exhibits normal muscle tone. Coordination normal.  Able to do bilateral straight leg raises. Equal strength bilaterally  Skin: Skin is warm and dry.  Psychiatric: He has a normal mood and affect.  Nursing note and vitals reviewed.   ED Course  Procedures (including critical care time) Labs Review Labs Reviewed - No data to display  Imaging Review No results found.   EKG Interpretation None      MDM   Final diagnoses:  Chronic back pain    Pt refusing to go to x-ray unless he gets narcotics. Discussed with pt that he was not going to get narcotics today as he has had multiple visits for the same complaint and has been to multiple locations to obtain them as noted on the drug database    Teressa LowerVrinda Farha Dano, NP 12/04/14 1941  Ethelda ChickMartha K Linker, MD 12/04/14 [phone]

## 2014-12-04 NOTE — ED Notes (Signed)
Fell backwards yesterday while going up a steep hill.  Fell onto back and right side.  Pain in bil low back, right flank, and radiating down left leg.  Sts he has back problems but this is worse.

## 2014-12-04 NOTE — ED Notes (Signed)
Pt refused to get x-ray until he received pain meds. Per EDPA Jaquita FoldsVirenda pt is not to received narcotics, gave v.o. For tylenol or ibuprofen, pt refused; pt states he is leaving if he is not getting pain meds

## 2015-04-08 ENCOUNTER — Encounter (HOSPITAL_BASED_OUTPATIENT_CLINIC_OR_DEPARTMENT_OTHER): Payer: Self-pay | Admitting: *Deleted

## 2015-04-08 ENCOUNTER — Emergency Department (HOSPITAL_BASED_OUTPATIENT_CLINIC_OR_DEPARTMENT_OTHER)
Admission: EM | Admit: 2015-04-08 | Discharge: 2015-04-09 | Discharge status: Against Medical Advice | Payer: 59 | Attending: Emergency Medicine | Admitting: Emergency Medicine

## 2015-04-08 DIAGNOSIS — G8929 Other chronic pain: Secondary | ICD-10-CM | POA: Insufficient documentation

## 2015-04-08 DIAGNOSIS — J449 Chronic obstructive pulmonary disease, unspecified: Secondary | ICD-10-CM | POA: Insufficient documentation

## 2015-04-08 DIAGNOSIS — Y9289 Other specified places as the place of occurrence of the external cause: Secondary | ICD-10-CM | POA: Insufficient documentation

## 2015-04-08 DIAGNOSIS — Y998 Other external cause status: Secondary | ICD-10-CM | POA: Insufficient documentation

## 2015-04-08 DIAGNOSIS — S0003XA Contusion of scalp, initial encounter: Secondary | ICD-10-CM

## 2015-04-08 DIAGNOSIS — Z720 Tobacco use: Secondary | ICD-10-CM | POA: Insufficient documentation

## 2015-04-08 DIAGNOSIS — S199XXA Unspecified injury of neck, initial encounter: Secondary | ICD-10-CM | POA: Insufficient documentation

## 2015-04-08 DIAGNOSIS — Z765 Malingerer [conscious simulation]: Secondary | ICD-10-CM

## 2015-04-08 DIAGNOSIS — W228XXA Striking against or struck by other objects, initial encounter: Secondary | ICD-10-CM | POA: Insufficient documentation

## 2015-04-08 DIAGNOSIS — Y9301 Activity, walking, marching and hiking: Secondary | ICD-10-CM | POA: Insufficient documentation

## 2015-04-08 NOTE — ED Notes (Signed)
Walked into a tree limp. It hit him in the top of his head. Neck pain since.

## 2015-04-09 NOTE — ED Notes (Signed)
Events of 0045 until patient left: Patient at the desk"I want to see this Dr. because I want some pain meds. Every time I come here I get crappy care, I have called before and nothing changes. I have been waiting 2 and half hours for pain medication and then the dr. tells us not yet. I want to talk to that Dr. to find out why I cant have pain medication, I am sick of f____ing waiting and this place is Fu--ing horrible, and don't give me that corporate bullshit, its the patient right and isn't about the fu--ing patient!!" and continues to repeat the same things over and over. The patient then screams that he wants to Fing leave so this RN [facility]. While I am doing this the patient is pacing out in the hallway trying to cause a scene. Trying to get him back into his room I asked him to sign the AMA paper work. He asked me if I have asked the dr. why he was unable to have pain meds, I did a long explanation of C-spine injuries and dulling the pain, MD molpus at the bedside to discuss the patients options.   at this time he was not going to get any pain meds, and the if he wanted to go to x-ray and have treatment we would be happy to follow through, other wise he would need to leave. Patient choice was to leave, AMA without signing the paper work

## 2015-04-09 NOTE — ED Provider Notes (Signed)
See Epic downtime documentation.   Corey LibraJohn Emma Birchler, MD 04/09/15 [phone]

## 2015-04-09 NOTE — ED Notes (Signed)
MD at bedside. 

## 2016-01-29 ENCOUNTER — Ambulatory Visit (HOSPITAL_COMMUNITY): Payer: Self-pay | Admitting: Psychiatry

## 2016-04-27 IMAGING — CR DG KNEE COMPLETE 4+V*L*
1 series · 4 of 4 positions shown · non-contrast
Comparison: None.

CLINICAL DATA: Injury to the left knee yesterday, patient states
that he was working on a tractor and apiece of the tractor struck
him in the suprapatellar region.

EXAM:
LEFT KNEE - COMPLETE 4+ VIEW

[Series 1: t knee ap left · 0.14mm/px · 4 of 4 slices shown]
[im 1/4]
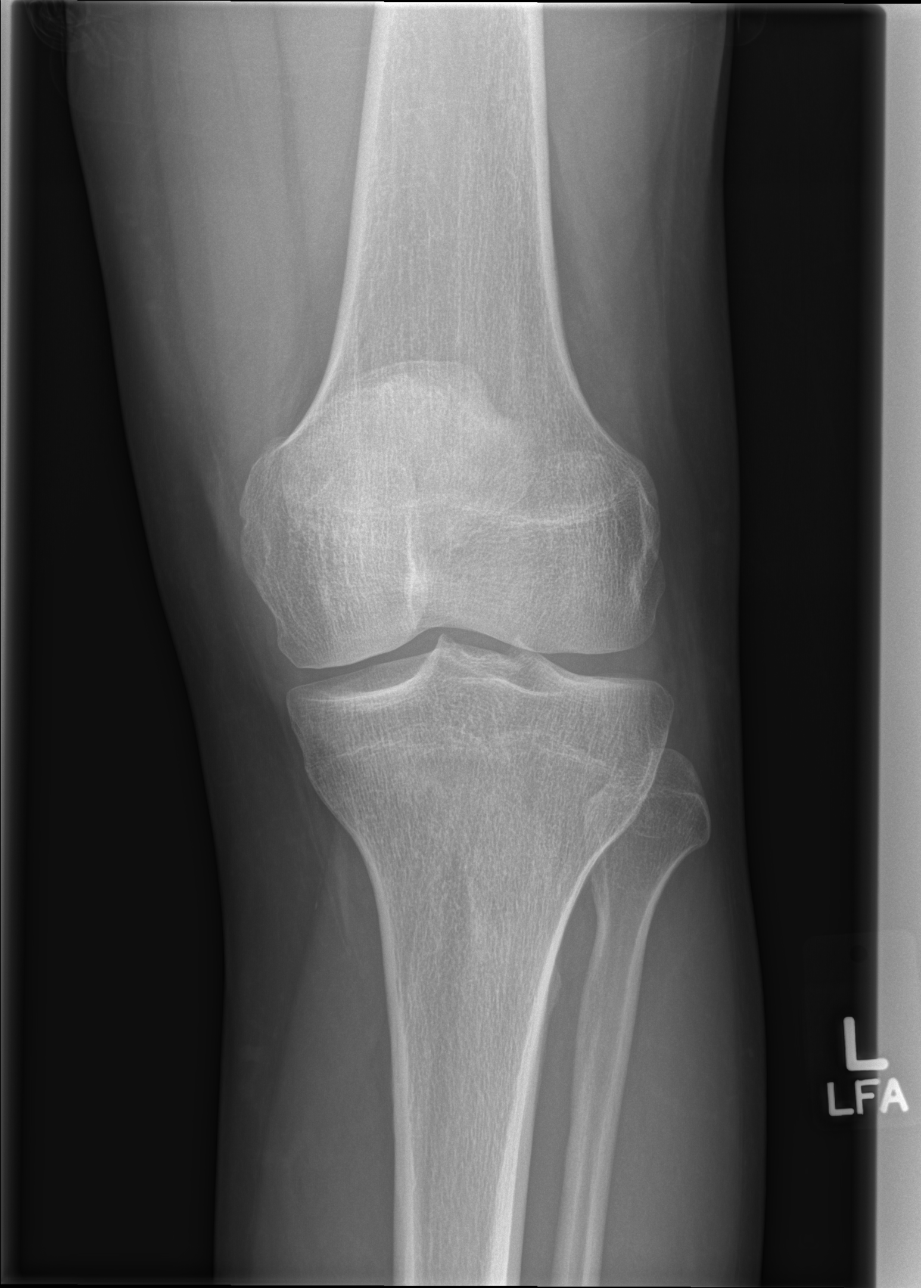
[im 2/4]
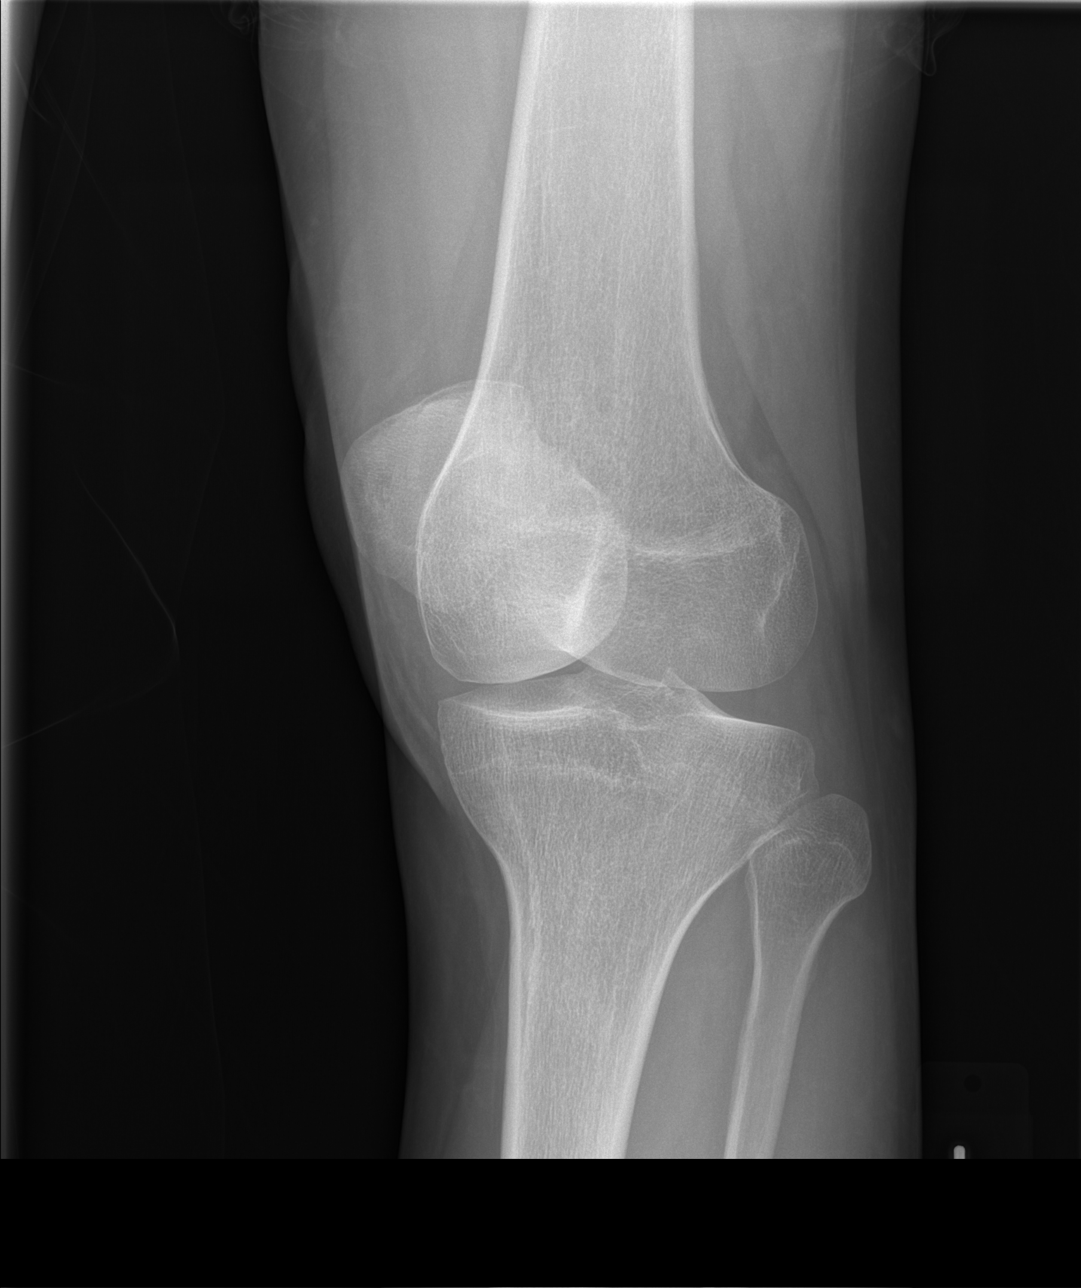
[im 3/4]
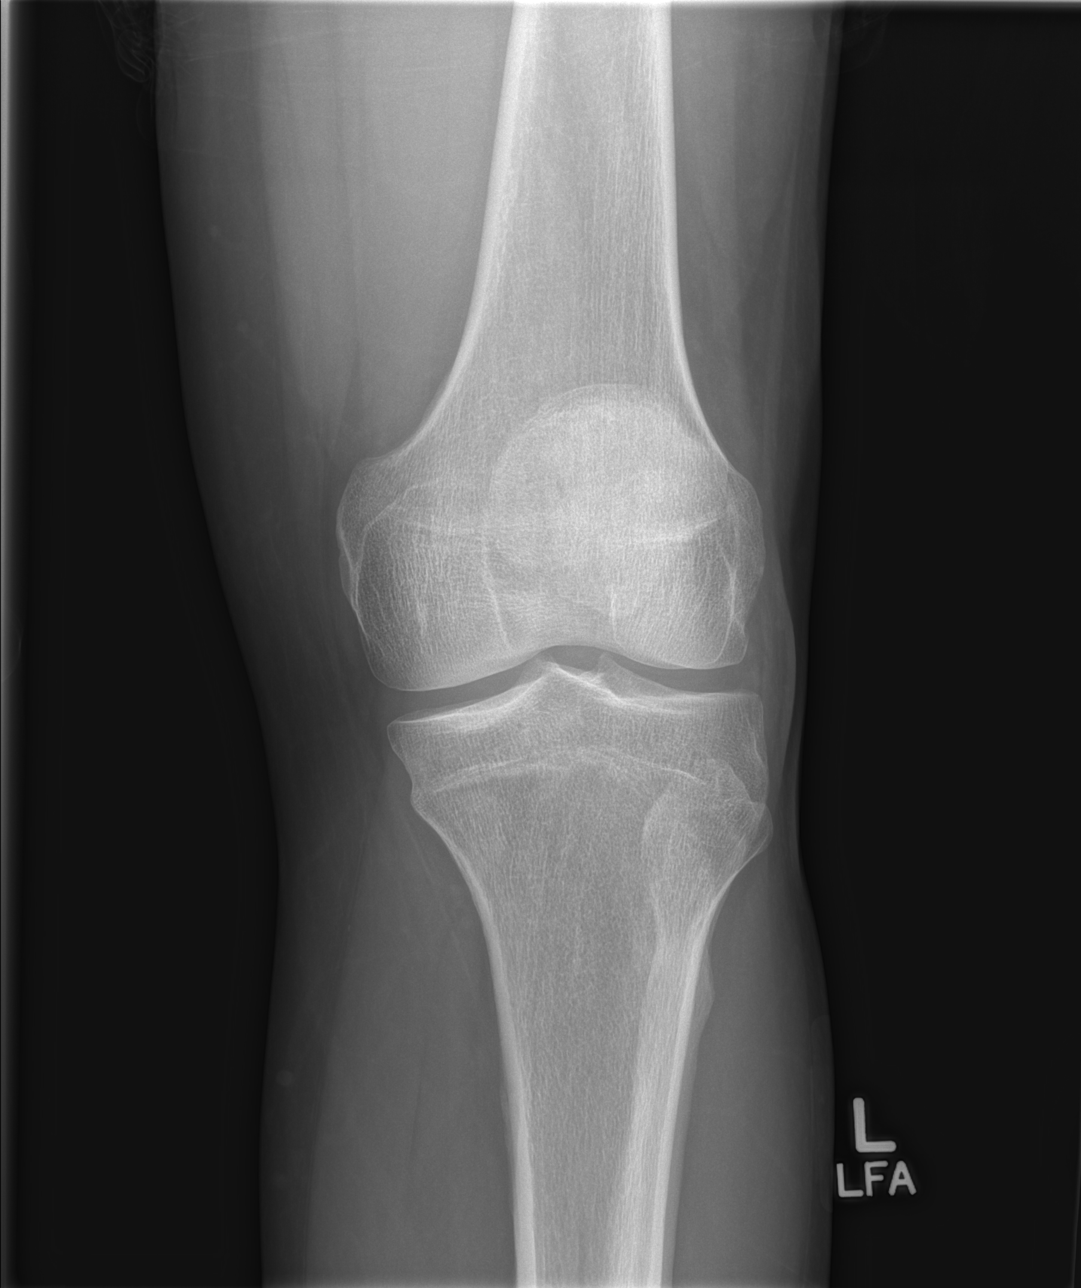
[im 4/4]
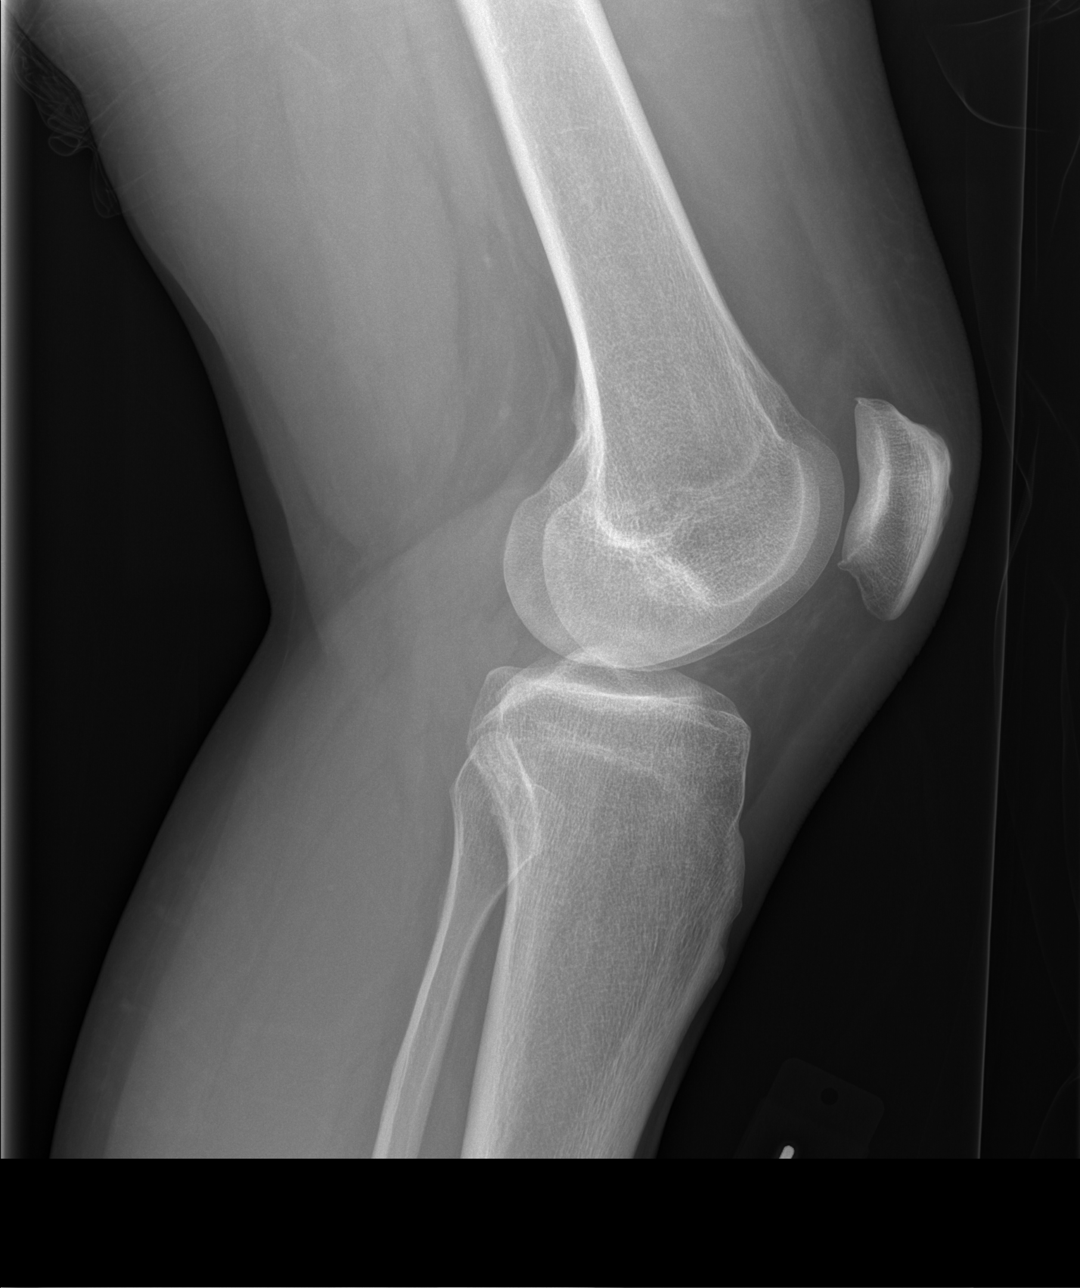

[4 of 4 positions shown; findings below may reference images not displayed]

FINDINGS: No evidence of acute fracture or dislocation. Joint spaces well
preserved. Well-preserved bone mineral density. Minimal spurring
along the undersurface of the patella. No visible joint effusion.
IMPRESSION: No acute or significant osseous abnormality.

## 2016-06-21 ENCOUNTER — Ambulatory Visit (HOSPITAL_COMMUNITY): Payer: Self-pay | Admitting: Psychiatry

## 2016-11-24 ENCOUNTER — Ambulatory Visit (HOSPITAL_COMMUNITY): Payer: Self-pay | Admitting: Psychiatry

## 2017-12-30 ENCOUNTER — Other Ambulatory Visit: Payer: Self-pay

## 2017-12-30 ENCOUNTER — Emergency Department (HOSPITAL_BASED_OUTPATIENT_CLINIC_OR_DEPARTMENT_OTHER): Payer: BLUE CROSS/BLUE SHIELD

## 2017-12-30 ENCOUNTER — Encounter (HOSPITAL_COMMUNITY)
Admission: EM | Disposition: A | Discharge status: Home / Self Care | Payer: Self-pay | Source: Home / Self Care | Attending: Orthopedic Surgery

## 2017-12-30 ENCOUNTER — Inpatient Hospital Stay (HOSPITAL_COMMUNITY): Payer: BLUE CROSS/BLUE SHIELD | Admitting: Anesthesiology

## 2017-12-30 ENCOUNTER — Encounter (HOSPITAL_BASED_OUTPATIENT_CLINIC_OR_DEPARTMENT_OTHER): Payer: Self-pay | Admitting: Emergency Medicine

## 2017-12-30 ENCOUNTER — Inpatient Hospital Stay (HOSPITAL_BASED_OUTPATIENT_CLINIC_OR_DEPARTMENT_OTHER)
Admission: EM | Admit: 2017-12-30 | Discharge: 2018-01-02 | DRG: 580 | Disposition: A | Discharge status: Home / Self Care | Payer: BLUE CROSS/BLUE SHIELD | Attending: Orthopedic Surgery | Admitting: Orthopedic Surgery

## 2017-12-30 DIAGNOSIS — L03012 Cellulitis of left finger: Secondary | ICD-10-CM | POA: Diagnosis present

## 2017-12-30 DIAGNOSIS — G8929 Other chronic pain: Secondary | ICD-10-CM | POA: Diagnosis present

## 2017-12-30 DIAGNOSIS — Z79899 Other long term (current) drug therapy: Secondary | ICD-10-CM | POA: Diagnosis not present

## 2017-12-30 DIAGNOSIS — Z8614 Personal history of Methicillin resistant Staphylococcus aureus infection: Secondary | ICD-10-CM

## 2017-12-30 DIAGNOSIS — B9561 Methicillin susceptible Staphylococcus aureus infection as the cause of diseases classified elsewhere: Secondary | ICD-10-CM | POA: Diagnosis present

## 2017-12-30 DIAGNOSIS — F329 Major depressive disorder, single episode, unspecified: Secondary | ICD-10-CM | POA: Diagnosis present

## 2017-12-30 DIAGNOSIS — Z885 Allergy status to narcotic agent status: Secondary | ICD-10-CM | POA: Diagnosis not present

## 2017-12-30 DIAGNOSIS — K0889 Other specified disorders of teeth and supporting structures: Secondary | ICD-10-CM | POA: Diagnosis present

## 2017-12-30 DIAGNOSIS — M65842 Other synovitis and tenosynovitis, left hand: Secondary | ICD-10-CM | POA: Diagnosis present

## 2017-12-30 DIAGNOSIS — Z9049 Acquired absence of other specified parts of digestive tract: Secondary | ICD-10-CM | POA: Diagnosis not present

## 2017-12-30 DIAGNOSIS — M549 Dorsalgia, unspecified: Secondary | ICD-10-CM | POA: Diagnosis present

## 2017-12-30 DIAGNOSIS — Y9389 Activity, other specified: Secondary | ICD-10-CM

## 2017-12-30 DIAGNOSIS — Z7952 Long term (current) use of systemic steroids: Secondary | ICD-10-CM | POA: Diagnosis not present

## 2017-12-30 DIAGNOSIS — K08409 Partial loss of teeth, unspecified cause, unspecified class: Secondary | ICD-10-CM | POA: Diagnosis present

## 2017-12-30 DIAGNOSIS — Z886 Allergy status to analgesic agent status: Secondary | ICD-10-CM | POA: Diagnosis not present

## 2017-12-30 DIAGNOSIS — F4310 Post-traumatic stress disorder, unspecified: Secondary | ICD-10-CM | POA: Diagnosis present

## 2017-12-30 DIAGNOSIS — L089 Local infection of the skin and subcutaneous tissue, unspecified: Secondary | ICD-10-CM | POA: Diagnosis present

## 2017-12-30 DIAGNOSIS — T63331A Toxic effect of venom of brown recluse spider, accidental (unintentional), initial encounter: Secondary | ICD-10-CM | POA: Diagnosis present

## 2017-12-30 DIAGNOSIS — J449 Chronic obstructive pulmonary disease, unspecified: Secondary | ICD-10-CM | POA: Diagnosis present

## 2017-12-30 DIAGNOSIS — F17210 Nicotine dependence, cigarettes, uncomplicated: Secondary | ICD-10-CM | POA: Diagnosis present

## 2017-12-30 DIAGNOSIS — L02512 Cutaneous abscess of left hand: Secondary | ICD-10-CM | POA: Diagnosis present

## 2017-12-30 DIAGNOSIS — S61233A Puncture wound without foreign body of left middle finger without damage to nail, initial encounter: Secondary | ICD-10-CM | POA: Diagnosis present

## 2017-12-30 DIAGNOSIS — S61452A Open bite of left hand, initial encounter: Secondary | ICD-10-CM

## 2017-12-30 HISTORY — PX: I&D EXTREMITY: SHX5045

## 2017-12-30 LAB — COMPREHENSIVE METABOLIC PANEL
ALT: 18 U/L (ref 17–63)
AST: 27 U/L (ref 15–41)
Albumin: 4.2 g/dL (ref 3.5–5.0)
Alkaline Phosphatase: 49 U/L (ref 38–126)
Anion gap: 7 (ref 5–15)
BUN: 21 mg/dL — ABNORMAL HIGH (ref 6–20)
CHLORIDE: 104 mmol/L (ref 101–111)
CO2: 26 mmol/L (ref 22–32)
CREATININE: 1.08 mg/dL (ref 0.61–1.24)
Calcium: 9.3 mg/dL (ref 8.9–10.3)
GFR calc Af Amer: >60 mL/min (ref >60)
GFR calc non Af Amer: >60 mL/min (ref >60)
Glucose, Bld: 103 mg/dL — ABNORMAL HIGH (ref 65–99)
Potassium: 4.6 mmol/L (ref 3.5–5.1)
Sodium: 137 mmol/L (ref 135–145)
Total Bilirubin: 0.4 mg/dL (ref 0.3–1.2)
Total Protein: 8.1 g/dL (ref 6.5–8.1)

## 2017-12-30 LAB — CBC WITH DIFFERENTIAL/PLATELET
BASOS ABS: 0 10*3/uL (ref 0.0–0.1)
Basophils Relative: 1 %
EOS PCT: 5 %
Eosinophils Absolute: 0.2 10*3/uL (ref 0.0–0.7)
HCT: 39.6 % (ref 39.0–52.0)
Hemoglobin: 13.2 g/dL (ref 13.0–17.0)
Lymphocytes Relative: 37 %
Lymphs Abs: 1.6 10*3/uL (ref 0.7–4.0)
MCH: 30.6 pg (ref 26.0–34.0)
MCHC: 33.3 g/dL (ref 30.0–36.0)
MCV: 91.7 fL (ref 78.0–100.0)
Monocytes Absolute: 0.5 10*3/uL (ref 0.1–1.0)
Monocytes Relative: 11 %
Neutro Abs: 2 10*3/uL (ref 1.7–7.7)
Neutrophils Relative %: 46 %
PLATELETS: 244 10*3/uL (ref 150–400)
RBC: 4.32 MIL/uL (ref 4.22–5.81)
RDW: 13.2 % (ref 11.5–15.5)
WBC: 4.2 10*3/uL (ref 4.0–10.5)

## 2017-12-30 SURGERY — IRRIGATION AND DEBRIDEMENT EXTREMITY
Anesthesia: General | Laterality: Left

## 2017-12-30 MED ORDER — METHOCARBAMOL 500 MG PO TABS
500.0000 mg | ORAL_TABLET | Freq: Four times a day (QID) | ORAL | Status: DC | PRN
  Administered 2017-12-30 – 2018-01-02 (×5): 500 mg via ORAL
  Filled 2017-12-30 (×4): qty 1

## 2017-12-30 MED ORDER — CEFAZOLIN SODIUM-DEXTROSE 2-4 GM/100ML-% IV SOLN: 2.0000 g | INTRAVENOUS | Status: DC

## 2017-12-30 MED ORDER — VANCOMYCIN HCL IN DEXTROSE 1-5 GM/200ML-% IV SOLN
1000.0000 mg | Freq: Three times a day (TID) | INTRAVENOUS | Status: DC
  Administered 2017-12-31 – 2018-01-02 (×8): 1000 mg via INTRAVENOUS
  Filled 2017-12-30 (×10): qty 200

## 2017-12-30 MED ORDER — METHADONE HCL 10 MG PO TABS
10.0000 mg | ORAL_TABLET | Freq: Three times a day (TID) | ORAL | Status: DC
Start: 2017-12-30 — End: 2018-01-02
  Administered 2017-12-30 – 2018-01-02 (×9): 10 mg via ORAL
  Filled 2017-12-30 (×9): qty 1

## 2017-12-30 MED ORDER — METHOCARBAMOL 500 MG PO TABS
ORAL_TABLET | ORAL | Status: AC
  Filled 2017-12-30: qty 1

## 2017-12-30 MED ORDER — MIDAZOLAM HCL 2 MG/2ML IJ SOLN
INTRAMUSCULAR | Status: AC
  Filled 2017-12-30: qty 2

## 2017-12-30 MED ORDER — FENTANYL CITRATE (PF) 100 MCG/2ML IJ SOLN
INTRAMUSCULAR | Status: AC
  Filled 2017-12-30: qty 2

## 2017-12-30 MED ORDER — VITAMIN C 500 MG PO TABS
1000.0000 mg | ORAL_TABLET | Freq: Every day | ORAL | Status: DC
  Administered 2017-12-31 – 2018-01-02 (×3): 1000 mg via ORAL
  Filled 2017-12-30 (×3): qty 2

## 2017-12-30 MED ORDER — DEXAMETHASONE SODIUM PHOSPHATE 10 MG/ML IJ SOLN
INTRAMUSCULAR | Status: DC | PRN
  Administered 2017-12-30: 10 mg via INTRAVENOUS

## 2017-12-30 MED ORDER — OXYCODONE HCL 5 MG/5ML PO SOLN: 5.0000 mg | Freq: Once | ORAL | Status: DC | PRN

## 2017-12-30 MED ORDER — FENTANYL CITRATE (PF) 250 MCG/5ML IJ SOLN
INTRAMUSCULAR | Status: DC | PRN
  Administered 2017-12-30 (×2): 50 ug via INTRAVENOUS
  Administered 2017-12-30: 100 ug via INTRAVENOUS
  Administered 2017-12-30: 50 ug via INTRAVENOUS

## 2017-12-30 MED ORDER — ONDANSETRON HCL 4 MG/2ML IJ SOLN: 4.0000 mg | Freq: Four times a day (QID) | INTRAMUSCULAR | Status: DC | PRN

## 2017-12-30 MED ORDER — ADULT MULTIVITAMIN W/MINERALS CH
1.0000 | ORAL_TABLET | Freq: Every day | ORAL | Status: DC
  Administered 2017-12-31 – 2018-01-02 (×3): 1 via ORAL
  Filled 2017-12-30 (×3): qty 1

## 2017-12-30 MED ORDER — VANCOMYCIN HCL IN DEXTROSE 1-5 GM/200ML-% IV SOLN
1000.0000 mg | Freq: Once | INTRAVENOUS | Status: DC
  Filled 2017-12-30: qty 200

## 2017-12-30 MED ORDER — POVIDONE-IODINE 10 % EX SWAB: 2.0000 "application " | Freq: Once | CUTANEOUS | Status: DC

## 2017-12-30 MED ORDER — PIPERACILLIN-TAZOBACTAM 3.375 G IVPB
3.3750 g | Freq: Three times a day (TID) | INTRAVENOUS | Status: DC
  Administered 2017-12-30 – 2018-01-02 (×9): 3.375 g via INTRAVENOUS
  Filled 2017-12-30 (×11): qty 50

## 2017-12-30 MED ORDER — GABAPENTIN 600 MG PO TABS
600.0000 mg | ORAL_TABLET | Freq: Three times a day (TID) | ORAL | Status: DC
  Administered 2017-12-30 – 2018-01-02 (×9): 600 mg via ORAL
  Filled 2017-12-30 (×9): qty 1

## 2017-12-30 MED ORDER — PROMETHAZINE HCL 25 MG/ML IJ SOLN
INTRAMUSCULAR | Status: AC
  Filled 2017-12-30: qty 1

## 2017-12-30 MED ORDER — SODIUM CHLORIDE 0.9 % IV BOLUS (SEPSIS)
1000.0000 mL | Freq: Once | INTRAVENOUS | Status: AC
  Administered 2017-12-30: 1000 mL via INTRAVENOUS

## 2017-12-30 MED ORDER — MIDAZOLAM HCL 5 MG/5ML IJ SOLN
INTRAMUSCULAR | Status: DC | PRN
  Administered 2017-12-30: 2 mg via INTRAVENOUS

## 2017-12-30 MED ORDER — LACTATED RINGERS IV SOLN
INTRAVENOUS | Status: DC
  Administered 2017-12-30 (×2): via INTRAVENOUS

## 2017-12-30 MED ORDER — PROPOFOL 10 MG/ML IV BOLUS
INTRAVENOUS | Status: DC | PRN
  Administered 2017-12-30: 200 mg via INTRAVENOUS

## 2017-12-30 MED ORDER — LACTATED RINGERS IV SOLN
INTRAVENOUS | Status: DC
  Administered 2017-12-31: 03:00:00 via INTRAVENOUS

## 2017-12-30 MED ORDER — LIDOCAINE HCL (CARDIAC) 20 MG/ML IV SOLN
INTRAVENOUS | Status: DC | PRN
  Administered 2017-12-30: 60 mg via INTRAVENOUS

## 2017-12-30 MED ORDER — PROMETHAZINE HCL 25 MG/ML IJ SOLN
6.2500 mg | INTRAMUSCULAR | Status: DC | PRN
  Administered 2017-12-30: 12.5 mg via INTRAVENOUS

## 2017-12-30 MED ORDER — FAMOTIDINE 20 MG PO TABS: 20.0000 mg | ORAL_TABLET | Freq: Two times a day (BID) | ORAL | Status: DC | PRN

## 2017-12-30 MED ORDER — VANCOMYCIN HCL IN DEXTROSE 1-5 GM/200ML-% IV SOLN
1000.0000 mg | Freq: Once | INTRAVENOUS | Status: AC
  Administered 2017-12-30: 1000 mg via INTRAVENOUS
  Filled 2017-12-30: qty 200

## 2017-12-30 MED ORDER — ACETAMINOPHEN 650 MG RE SUPP: 650.0000 mg | Freq: Four times a day (QID) | RECTAL | Status: DC | PRN

## 2017-12-30 MED ORDER — VANCOMYCIN HCL 1000 MG IV SOLR
INTRAVENOUS | Status: DC | PRN
  Administered 2017-12-30: 1000 mg via INTRAVENOUS

## 2017-12-30 MED ORDER — HYDROMORPHONE HCL 1 MG/ML IJ SOLN
INTRAMUSCULAR | Status: AC
  Filled 2017-12-30: qty 1

## 2017-12-30 MED ORDER — OXYCODONE HCL 5 MG PO TABS: 5.0000 mg | ORAL_TABLET | Freq: Once | ORAL | Status: DC | PRN

## 2017-12-30 MED ORDER — METAXALONE 800 MG PO TABS
800.0000 mg | ORAL_TABLET | Freq: Three times a day (TID) | ORAL | Status: DC
  Administered 2017-12-31 – 2018-01-02 (×7): 800 mg via ORAL
  Filled 2017-12-30 (×10): qty 1

## 2017-12-30 MED ORDER — GABAPENTIN 400 MG PO CAPS: 400.0000 mg | ORAL_CAPSULE | Freq: Three times a day (TID) | ORAL | Status: DC

## 2017-12-30 MED ORDER — ONDANSETRON HCL 4 MG PO TABS: 4.0000 mg | ORAL_TABLET | Freq: Four times a day (QID) | ORAL | Status: DC | PRN

## 2017-12-30 MED ORDER — HYDROMORPHONE HCL 1 MG/ML IJ SOLN
0.2500 mg | INTRAMUSCULAR | Status: DC | PRN
  Administered 2017-12-30: 0.5 mg via INTRAVENOUS

## 2017-12-30 MED ORDER — SODIUM CHLORIDE 0.9 % IR SOLN
Status: DC | PRN
  Administered 2017-12-30: 3000 mL

## 2017-12-30 MED ORDER — CHLORHEXIDINE GLUCONATE 4 % EX LIQD: 60.0000 mL | Freq: Once | CUTANEOUS | Status: DC

## 2017-12-30 MED ORDER — DOCUSATE SODIUM 100 MG PO CAPS
100.0000 mg | ORAL_CAPSULE | Freq: Two times a day (BID) | ORAL | Status: DC
  Administered 2017-12-31 – 2018-01-02 (×5): 100 mg via ORAL
  Filled 2017-12-30 (×6): qty 1

## 2017-12-30 MED ORDER — PROMETHAZINE HCL 12.5 MG RE SUPP
12.5000 mg | Freq: Four times a day (QID) | RECTAL | Status: DC | PRN
  Filled 2017-12-30: qty 1

## 2017-12-30 MED ORDER — PROPOFOL 10 MG/ML IV BOLUS
INTRAVENOUS | Status: AC
  Filled 2017-12-30: qty 20

## 2017-12-30 MED ORDER — ENOXAPARIN SODIUM 40 MG/0.4ML ~~LOC~~ SOLN
40.0000 mg | SUBCUTANEOUS | Status: DC
  Administered 2017-12-31: 40 mg via SUBCUTANEOUS
  Filled 2017-12-30: qty 0.4

## 2017-12-30 MED ORDER — FENTANYL CITRATE (PF) 250 MCG/5ML IJ SOLN
INTRAMUSCULAR | Status: AC
  Filled 2017-12-30: qty 5

## 2017-12-30 MED ORDER — MORPHINE SULFATE (PF) 4 MG/ML IV SOLN: 2.0000 mg | INTRAVENOUS | Status: DC | PRN

## 2017-12-30 MED ORDER — ONDANSETRON HCL 4 MG/2ML IJ SOLN
4.0000 mg | Freq: Once | INTRAMUSCULAR | Status: AC
  Administered 2017-12-30: 4 mg via INTRAVENOUS
  Filled 2017-12-30: qty 2

## 2017-12-30 MED ORDER — CEFEPIME HCL 1 G IJ SOLR
INTRAMUSCULAR | Status: AC
  Filled 2017-12-30: qty 1

## 2017-12-30 MED ORDER — ACETAMINOPHEN 325 MG PO TABS: 650.0000 mg | ORAL_TABLET | Freq: Four times a day (QID) | ORAL | Status: DC | PRN

## 2017-12-30 MED ORDER — FENTANYL CITRATE (PF) 100 MCG/2ML IJ SOLN
25.0000 ug | INTRAMUSCULAR | Status: DC | PRN
  Administered 2017-12-30 (×3): 50 ug via INTRAVENOUS

## 2017-12-30 MED ORDER — SODIUM CHLORIDE 0.9 % IV SOLN
1.0000 g | Freq: Once | INTRAVENOUS | Status: AC
  Administered 2017-12-30: 1 g via INTRAVENOUS

## 2017-12-30 MED ORDER — METHOCARBAMOL 1000 MG/10ML IJ SOLN
500.0000 mg | Freq: Four times a day (QID) | INTRAVENOUS | Status: DC | PRN
  Filled 2017-12-30 (×2): qty 5

## 2017-12-30 MED ORDER — OXYCODONE HCL 5 MG PO TABS
5.0000 mg | ORAL_TABLET | ORAL | Status: DC | PRN
  Administered 2017-12-30 – 2018-01-02 (×12): 10 mg via ORAL
  Filled 2017-12-30 (×11): qty 2

## 2017-12-30 MED ORDER — ONDANSETRON HCL 4 MG/2ML IJ SOLN
INTRAMUSCULAR | Status: DC | PRN
  Administered 2017-12-30: 4 mg via INTRAVENOUS

## 2017-12-30 MED ORDER — HYDROMORPHONE HCL 1 MG/ML IJ SOLN
0.5000 mg | Freq: Once | INTRAMUSCULAR | Status: AC
  Administered 2017-12-30: 0.5 mg via INTRAVENOUS
  Filled 2017-12-30: qty 1

## 2017-12-30 SURGICAL SUPPLY — 40 items
BANDAGE ACE 4X5 VEL STRL LF (GAUZE/BANDAGES/DRESSINGS) ×6 IMPLANT
BNDG CONFORM 2 STRL LF (GAUZE/BANDAGES/DRESSINGS) ×3 IMPLANT
BNDG GAUZE ELAST 4 BULKY (GAUZE/BANDAGES/DRESSINGS) ×3 IMPLANT
CORDS BIPOLAR (ELECTRODE) ×3 IMPLANT
COVER SURGICAL LIGHT HANDLE (MISCELLANEOUS) ×3 IMPLANT
CUFF TOURNIQUET SINGLE 18IN (TOURNIQUET CUFF) ×3 IMPLANT
CUFF TOURNIQUET SINGLE 24IN (TOURNIQUET CUFF) IMPLANT
DRSG ADAPTIC 3X8 NADH LF (GAUZE/BANDAGES/DRESSINGS) ×3 IMPLANT
DRSG MEPILEX BORDER 4X4 (GAUZE/BANDAGES/DRESSINGS) ×3 IMPLANT
DRSG MEPITEL 4X7.2 (GAUZE/BANDAGES/DRESSINGS) ×3 IMPLANT
GAUZE SPONGE 4X4 12PLY STRL (GAUZE/BANDAGES/DRESSINGS) ×3 IMPLANT
GAUZE XEROFORM 1X8 LF (GAUZE/BANDAGES/DRESSINGS) ×3 IMPLANT
GLOVE BIOGEL M 8.0 STRL (GLOVE) ×3 IMPLANT
GLOVE SS BIOGEL STRL SZ 8 (GLOVE) ×1 IMPLANT
GLOVE SUPERSENSE BIOGEL SZ 8 (GLOVE) ×2
GOWN STRL REUS W/ TWL LRG LVL3 (GOWN DISPOSABLE) ×1 IMPLANT
GOWN STRL REUS W/ TWL XL LVL3 (GOWN DISPOSABLE) ×2 IMPLANT
GOWN STRL REUS W/TWL LRG LVL3 (GOWN DISPOSABLE) ×2
GOWN STRL REUS W/TWL XL LVL3 (GOWN DISPOSABLE) ×4
KIT BASIN OR (CUSTOM PROCEDURE TRAY) ×3 IMPLANT
KIT ROOM TURNOVER OR (KITS) ×3 IMPLANT
MANIFOLD NEPTUNE II (INSTRUMENTS) ×3 IMPLANT
NEEDLE HYPO 25GX1X1/2 BEV (NEEDLE) IMPLANT
NS IRRIG 1000ML POUR BTL (IV SOLUTION) ×3 IMPLANT
PACK ORTHO EXTREMITY (CUSTOM PROCEDURE TRAY) ×3 IMPLANT
PAD ARMBOARD 7.5X6 YLW CONV (MISCELLANEOUS) ×3 IMPLANT
PAD CAST 4YDX4 CTTN HI CHSV (CAST SUPPLIES) ×1 IMPLANT
PADDING CAST COTTON 4X4 STRL (CAST SUPPLIES) ×2
SCRUB BETADINE 4OZ XXX (MISCELLANEOUS) ×3 IMPLANT
SOL PREP POV-IOD 4OZ 10% (MISCELLANEOUS) ×3 IMPLANT
SPLINT FIBERGLASS 4X30 (CAST SUPPLIES) ×3 IMPLANT
SPONGE LAP 4X18 X RAY DECT (DISPOSABLE) ×3 IMPLANT
SWAB CULTURE ESWAB REG 1ML (MISCELLANEOUS) IMPLANT
SYR CONTROL 10ML LL (SYRINGE) IMPLANT
TOWEL OR 17X24 6PK STRL BLUE (TOWEL DISPOSABLE) ×3 IMPLANT
TOWEL OR 17X26 10 PK STRL BLUE (TOWEL DISPOSABLE) ×3 IMPLANT
TUBE CONNECTING 12'X1/4 (SUCTIONS) ×1
TUBE CONNECTING 12X1/4 (SUCTIONS) ×2 IMPLANT
WATER STERILE IRR 1000ML POUR (IV SOLUTION) ×3 IMPLANT
YANKAUER SUCT BULB TIP NO VENT (SUCTIONS) ×3 IMPLANT

## 2017-12-30 NOTE — Transfer of Care (Signed)
Immediate Anesthesia Transfer of Care Note  Patient: Corey FujitaMarcus L Osberg  Procedure(s) Performed: IRRIGATION AND DEBRIDEMENT HAND (Left )  Patient Location: PACU  Anesthesia Type:General  Level of Consciousness: awake, alert  and oriented  Airway & Oxygen Therapy: Patient Spontanous Breathing and Patient connected to face mask oxygen  Post-op Assessment: Report given to RN and Post -op Vital signs reviewed and stable  Post vital signs: Reviewed and stable  Last Vitals:  Vitals:   12/30/17 1519 12/30/17 1534  BP:  130/71  Pulse: 72 72  Resp:  18  Temp:  37 C  SpO2:  100%    Last Pain:  Vitals:   12/30/17 1534  TempSrc: Oral  PainSc:          Complications: No apparent anesthesia complications

## 2017-12-30 NOTE — H&P (Signed)
Patient has been seen and examined.  Would recommend formal irrigation and debridement.  He has an obvious deep infection about the webspace and middle finger.  We will plan to perform tenosynovectomy I&D and repair reconstruction is necessary.  We will send off formal cultures and plan for admission.  I discussed with the patient all issues concerns and our plans.  Addy Mcmannis MD

## 2017-12-30 NOTE — ED Provider Notes (Signed)
  Physical Exam  BP 130/71 (BP Location: Right Arm)   Pulse 72   Temp 98.6 F (37 C) (Oral)   Resp 18   Ht 6\' 1"  (1.854 m)   Wt 85.7 kg (189 lb)   SpO2 100%   BMI 24.94 kg/m   Physical Exam  Constitutional: He appears well-developed and well-nourished. No distress.  HENT:  Head: Normocephalic and atraumatic.  Eyes: Conjunctivae and EOM are normal. No scleral icterus.  Neck: Normal range of motion.  Pulmonary/Chest: Effort normal. No respiratory distress.  Neurological: He is alert.  Skin: No rash noted. He is not diaphoretic. There is erythema.  Edema and ulceration of the left third digit with discharge noted.  There is apparent necrosis surrounding the area.  Swelling around the base of the proximal phalanx with reduced range of motion.   Psychiatric: He has a normal mood and affect.  Nursing note and vitals reviewed.   ED Course/Procedures     Procedures  MDM   [facility], surgery as recommended by Dr. Amanda PeaGramig for what appears to be cellulitis caused by a brown recluse bite on L 3rd digit approx 1 week ago.  Area is significantly swollen, erythematous with drainage noted.  Orthopedic PA Earney HamburgMichael Jeffrey aware of patient here in this ED and is at bedside.  Patient in NAD at this time.  Portions of this note were generated with Scientist, clinical (histocompatibility and immunogenetics)Dragon dictation software. Dictation errors may occur despite best attempts at proofreading.     Dietrich PatesKhatri, Kristyn Obyrne, PA-C 12/30/17 1540    Gwyneth SproutPlunkett, Whitney, MD 12/31/17 [phone]

## 2017-12-30 NOTE — ED Notes (Signed)
Spoke with Wendall StadeW. Gramig MD concerning order for Morphine IV and patient reported rash and anaphylaxis.  MD to come speak with patient in the ED.

## 2017-12-30 NOTE — ED Provider Notes (Signed)
[facility] EMERGENCY DEPARTMENT Provider Note   CSN: [id number] Arrival date & time: 12/30/17  0908     History   Chief Complaint Chief Complaint  Patient presents with  . Wound Infection    HPI   Blood pressure 122/81, pulse 74, temperature 97.7 F (36.5 C), temperature source Oral, resp. rate 18, height 6\' 1"  (1.854 m), weight 85.7 kg (189 lb), SpO2 98 %.  [name] is a 47 y.o. male with past medical history significant for COPD complaining of increasing pain and swelling to left third digit.  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  He states that he felt the bite when he reached into a dark area to pick up an item.  He looked at the area and there was no obvious trauma, bleeding, puncture wound.  Pain and swelling continued to worsen over the course of several days.  He was seen at his primary care's office urgent care center on Monday, February 25.  He was given a shot of Rocephin and started on Keflex.  He states that there is slight improvement but it continues to swell and have discharge.  His last tetanus shot was within the last 3 years.  Patient is right-hand dominant.  No focal tenderness along the flexor tendon sheath.   Past Medical History:  Diagnosis Date  . ADD (attention deficit disorder)   . Arthritis   . Chronic back pain   . COPD (chronic obstructive pulmonary disease) (HCC)   . Depression   . Seizures (HCC)    only once 12/30/2012    Patient Active Problem List   Diagnosis Date Noted  . MDD (major depressive disorder) 02/14/2014  . ADHD (attention deficit hyperactivity disorder) 01/04/2013  . PTSD (post-traumatic stress disorder) 01/04/2013    Past Surgical History:  Procedure Laterality Date  . APPENDECTOMY  2014  . MANDIBLE FRACTURE SURGERY  2014       Home Medications    Prior to Admission medications   Medication Sig Start Date End Date Taking?  Authorizing Provider  b complex vitamins tablet Take 1 tablet by mouth daily.    [provider]  gabapentin (NEURONTIN) 400 MG capsule Take 1 capsule (400 mg total) by mouth 3 (three) times daily. 08/27/14   Arfeen, Phillips Grout, MD  HYDROcodone-acetaminophen (NORCO/VICODIN) 5-325 MG per tablet Take 1 tablet by mouth every 6 (six) hours as needed for moderate pain. 09/22/14   Horton, Mayer Masker, MD  ibuprofen (ADVIL,MOTRIN) 200 MG tablet Take 800 mg by mouth daily as needed for moderate pain.    [provider]  metaxalone (SKELAXIN) 800 MG tablet Take 1 tablet (800 mg total) by mouth 3 (three) times daily. 08/31/14   Linna Hoff, MD  methadone (DOLOPHINE) 10 MG tablet Take 10 mg by mouth every 8 (eight) hours.    [provider]  methocarbamol (ROBAXIN) 500 MG tablet Take 1 tablet (500 mg total) by mouth 3 (three) times daily between meals as needed. 10/12/14   Rolland Porter, MD  mirtazapine (REMERON) 15 MG tablet Take 1 tablet (15 mg total) by mouth at bedtime. 08/27/14 08/27/15  Arfeen, Phillips Grout, MD  ondansetron (ZOFRAN) 4 MG tablet Take 1 tablet (4 mg total) by mouth every 8 (eight) hours as needed for nausea or vomiting. 09/22/14   Horton, Mayer Masker, MD  oxyCODONE-acetaminophen (PERCOCET/ROXICET) 5-325 MG per tablet Take 2 tablets  by mouth every 4 (four) hours as needed. 10/12/14   Rolland PorterJames, Mark, MD  predniSONE (DELTASONE) 20 MG tablet Take 3 tablets (60 mg total) by mouth daily with breakfast. 09/08/14   Pollina, Canary Brimhristopher J, MD    Family History Family History  Problem Relation Age of Onset  . Diabetes Mother     Social History Social History   Tobacco Use  . Smoking status: Current Every Day Smoker    Packs/day: 0.50    Types: Cigarettes  . Smokeless tobacco: Never Used  Substance Use Topics  . Alcohol use: No  . Drug use: No     Allergies   Aspirin; Ketorolac tromethamine; and Tramadol   Review of Systems Review of Systems  A complete review of  systems was obtained and all systems are negative except as noted in the HPI and PMH.   Physical Exam Updated Vital Signs BP 120/72   Pulse (!) 57   Temp 97.7 F (36.5 C)   Resp 18   Ht 6\' 1"  (1.854 m)   Wt 85.7 kg (189 lb)   SpO2 100%   BMI 24.94 kg/m   Physical Exam  Constitutional: He is oriented to person, place, and time. He appears well-developed and well-nourished. No distress.  HENT:  Head: Normocephalic and atraumatic.  Mouth/Throat: Oropharynx is clear and moist.  Eyes: Conjunctivae and EOM are normal. Pupils are equal, round, and reactive to light.  Neck: Normal range of motion.  Cardiovascular: Normal rate, regular rhythm and intact distal pulses.  Pulmonary/Chest: Effort normal and breath sounds normal.  Abdominal: Soft. There is no tenderness.  Musculoskeletal: He exhibits edema and tenderness.  Left middle finger: Edematous with 2 cm ulceration, necrotic at the edges, no foul smell or gross discharge.  Extensive swelling along the base of the left third proximal phalanx reduced range of motion to the PIP.  Swelling along the dorsum of the hand brisk cap refill distally.  Neurological: He is alert and oriented to person, place, and time.  Skin: He is not diaphoretic.  Psychiatric: He has a normal mood and affect.  Nursing note and vitals reviewed.          ED Treatments / Results  Labs (all labs ordered are listed, but only abnormal results are displayed) Labs Reviewed  COMPREHENSIVE METABOLIC PANEL - Abnormal; Notable for the following components:      Result Value   Glucose, Bld 103 (*)    BUN 21 (*)    All other components within normal limits  CBC WITH DIFFERENTIAL/PLATELET  CBC WITH DIFFERENTIAL/PLATELET    EKG  EKG Interpretation None       Radiology Dg Finger Middle Left  Result Date: 12/30/2017 CLINICAL DATA:  Cellulitis.  Evaluate for osteomyelitis. EXAM: LEFT MIDDLE FINGER 2+V COMPARISON:  No prior. FINDINGS: Soft tissue swelling.  No radiopaque foreign body. No acute or focal bony abnormality identified. No bony erosions noted. IMPRESSION: 1. Soft tissue swelling. No radiopaque foreign body. 2. No focal bony abnormality. Electronically Signed   By: Maisie Fushomas  Register   On: 12/30/2017 09:53    Procedures Procedures (including critical care time)  Medications Ordered in ED Medications  ceFEPIme (MAXIPIME) 1 g injection (not administered)  HYDROmorphone (DILAUDID) injection 0.5 mg (0.5 mg Intravenous Given 12/30/17 1055)  ondansetron (ZOFRAN) injection 4 mg (4 mg Intravenous Given 12/30/17 1055)  sodium chloride 0.9 % bolus 1,000 mL (0 mLs Intravenous Stopped 12/30/17 1230)  vancomycin (VANCOCIN) IVPB 1000 mg/200 mL premix (0 mg Intravenous  Stopped 12/30/17 1230)  ceFEPIme (MAXIPIME) 1 g in sodium chloride 0.9 % 100 mL IVPB (0 g Intravenous Stopped 12/30/17 1230)     Initial Impression / Assessment and Plan / ED Course  I have reviewed the triage vital signs and the nursing notes.  Pertinent labs & imaging results that were available during my care of the patient were reviewed by me and considered in my medical decision making (see chart for details).    Vitals:   12/30/17 0924 12/30/17 0925 12/30/17 1134 12/30/17 1250  BP: 122/81  127/64 120/72  Pulse: 74  (!) 54 (!) 57  Resp: 18  16 18   Temp: 97.7 F (36.5 C)   97.7 F (36.5 C)  TempSrc: Oral     SpO2: 98%  96% 100%  Weight:  85.7 kg (189 lb)    Height:  6\' 1"  (1.854 m)      Medications  ceFEPIme (MAXIPIME) 1 g injection (not administered)  HYDROmorphone (DILAUDID) injection 0.5 mg (0.5 mg Intravenous Given 12/30/17 1055)  ondansetron (ZOFRAN) injection 4 mg (4 mg Intravenous Given 12/30/17 1055)  sodium chloride 0.9 % bolus 1,000 mL (0 mLs Intravenous Stopped 12/30/17 1230)  vancomycin (VANCOCIN) IVPB 1000 mg/200 mL premix (0 mg Intravenous Stopped 12/30/17 1230)  ceFEPIme (MAXIPIME) 1 g in sodium chloride 0.9 % 100 mL IVPB (0 g Intravenous Stopped 12/30/17 1230)     ROYALTY DOMAGALA is 47 y.o. male presenting with significant cellulitis and possible brown recluse bite to left middle digit.  He has been on antibiotic therapy as an outpatient and wound continues to worsen with, no signs of systemic infection.  Orthopedic consult from Arlys John, Dr. Carlos Levering APP appreciated: He recommends transfer to [facility] for surgery this afternoon and IV antibiotics.  I had an extensive discussion with this patient on the importance of remaining n.p.o., patient verbalized understanding and teach back technique.  They will go by private vehicle.  IVs will be removed because they need to run an errand before [facility].  Discussed with Dr. Stefanie Libel he was working in [employer] today     Final Clinical Impressions(s) / ED Diagnoses   Final diagnoses:  Cellulitis of finger of left hand    ED Discharge Orders    None       Karena Kinker, Mardella Layman 12/30/17 1325    Charlynne Pander, MD 01/01/18 [phone]

## 2017-12-30 NOTE — Anesthesia Preprocedure Evaluation (Addendum)
Anesthesia Evaluation  Patient identified by MRN, date of birth, ID band Patient awake    Reviewed: Allergy & Precautions, NPO status , Patient's Chart, lab work & pertinent test results  Airway Mallampati: II  TM Distance: >3 FB Neck ROM: Full    Dental  (+) Missing, Poor Dentition, Chipped, Dental Advisory Given   Pulmonary COPD, Current Smoker,    Pulmonary exam normal breath sounds clear to auscultation       Cardiovascular negative cardio ROS Normal cardiovascular exam Rhythm:Regular Rate:Normal     Neuro/Psych negative neurological ROS  negative psych ROS   GI/Hepatic negative GI ROS, Neg liver ROS,   Endo/Other  negative endocrine ROS  Renal/GU negative Renal ROS  negative genitourinary   Musculoskeletal negative musculoskeletal ROS (+)   Abdominal   Peds negative pediatric ROS (+)  Hematology negative hematology ROS (+)   Anesthesia Other Findings   Reproductive/Obstetrics negative OB ROS                            Anesthesia Physical Anesthesia Plan  ASA: II  Anesthesia Plan: General   Post-op Pain Management:    Induction: Intravenous  PONV Risk Score and Plan: 2 and Ondansetron and Dexamethasone  Airway Management Planned: LMA  Additional Equipment:   Intra-op Plan:   Post-operative Plan: Extubation in OR  Informed Consent: I have reviewed the patients History and Physical, chart, labs and discussed the procedure including the risks, benefits and alternatives for the proposed anesthesia with the patient or authorized representative who has indicated his/her understanding and acceptance.   Dental advisory given  Plan Discussed with: CRNA and Surgeon  Anesthesia Plan Comments:         Anesthesia Quick Evaluation

## 2017-12-30 NOTE — ED Notes (Signed)
Ortho PA at bedside.  

## 2017-12-30 NOTE — Op Note (Signed)
See dictation#[id number] SP I&D left hand Will need IV Abx - Vanc and Zosyn and await Cx with repeat I&D planned Sandria Mcenroe MD

## 2017-12-30 NOTE — Anesthesia Procedure Notes (Signed)
Procedure Name: LMA Insertion Date/Time: 12/30/2017 6:54 PM Performed by: Izola Priceockfield, Merlean Pizzini Walton Jr., CRNA Pre-anesthesia Checklist: Patient identified, Emergency Drugs available, Suction available, Patient being monitored and Timeout performed Patient Re-evaluated:Patient Re-evaluated prior to induction Oxygen Delivery Method: Circle system utilized Preoxygenation: Pre-oxygenation with 100% oxygen Induction Type: IV induction LMA: LMA inserted LMA Size: 4.0 Number of attempts: 1 Placement Confirmation: positive ETCO2 and breath sounds checked- equal and bilateral Dental Injury: Teeth and Oropharynx as per pre-operative assessment

## 2017-12-30 NOTE — H&P (Signed)
HASAAN RADDE is an 47 y.o. male.   Chief Complaint: Left middle finger infection HPI: Deray was at work on Thursday of last week. He reached down to pick up a bucket in a trailer and felt a small pinch/sting on his left hand but didn't think much of it. By the next day it was red and swollen and continued to get worse over the weekend. He sought care Monday and was given a shot of what sounds like a cephalasporin (Rocephin?) and put on Keflex 51m qid. Things have not gotten much better since then and 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  Past Medical History:  Diagnosis Date  . ADD (attention deficit disorder)   . Arthritis   . Chronic back pain   . COPD (chronic obstructive pulmonary disease) ([place]   . Depression   . Seizures ([place]    only once 12/30/2012    Past Surgical History:  Procedure Laterality Date  . APPENDECTOMY  2014  . MANDIBLE FRACTURE SURGERY  2014    Family History  Problem Relation Age of Onset  . Diabetes Mother    Social History:  reports that he has been smoking cigarettes.  He has been smoking about 0.50 packs per day. he has never used smokeless tobacco. He reports that he does not drink alcohol or use drugs.  Allergies:  Allergies  Allergen Reactions  . Aspirin Anaphylaxis  . Ketorolac Tromethamine Rash  . Tramadol Rash    Face and arms. Patient states that he is allergic to tramadol     (Not in a hospital admission)  Results for orders placed or performed during the hospital encounter of 12/30/17 (from the past 48 hour(s))  CBC with Differential/Platelet     Status: None   Collection Time: 12/30/17  9:48 AM  Result Value Ref Range   WBC 4.2 4.0 - 10.5 K/uL   RBC 4.32 4.22 - 5.81 MIL/uL   Hemoglobin 13.2 13.0 - 17.0 g/dL   HCT 39.6 39.0 - 52.0 %   MCV 91.7 78.0 - 100.0 fL   MCH 30.6 26.0 - 34.0 pg   MCHC 33.3 30.0 - 36.0 g/dL   RDW 13.2 11.5 - 15.5 %   Platelets 244 150 - 400 K/uL   Neutrophils Relative % 46 %   Neutro Abs 2.0 1.7 - 7.7 K/uL   Lymphocytes Relative 37 %   Lymphs Abs 1.6 0.7 - 4.0 K/uL   Monocytes Relative 11 %   Monocytes Absolute 0.5 0.1 - 1.0 K/uL   Eosinophils Relative 5 %   Eosinophils Absolute 0.2 0.0 - 0.7 K/uL   Basophils Relative 1 %   Basophils Absolute 0.0 0.0 - 0.1 K/uL    Comment: [facility]formed at [facility] [address], [id number] Comprehensive metabolic panel     Status: Abnormal   Collection Time: 12/30/17  9:48 AM  Result Value Ref Range   Sodium 137 135 - 145 mmol/L   Potassium 4.6 3.5 - 5.1 mmol/L   Chloride 104 101 - 111 mmol/L   CO2 26 22 - 32 mmol/L   Glucose, Bld 103 (H) 65 - 99 mg/dL   BUN 21 (H) 6 - 20 mg/dL   Creatinine, Ser 1.08 0.61 - 1.24 mg/dL   Calcium 9.3 8.9 - 10.3 mg/dL   Total Protein 8.1 6.5 - 8.1 g/dL  Albumin 4.2 3.5 - 5.0 g/dL   AST 27 15 - 41 U/L   ALT 18 17 - 63 U/L   Alkaline Phosphatase 49 38 - 126 U/L   Total Bilirubin 0.4 0.3 - 1.2 mg/dL   GFR calc non Af Amer >60 >60 mL/min   GFR calc Af Amer >60 >60 mL/min    Comment: (NOTE) The eGFR has been calculated using the CKD EPI equation. This calculation has not been validated in all clinical situations. eGFR's persistently <60 mL/min signify possible Chronic Kidney Disease.    Anion gap 7 5 - 15    Comment: Performed at [facility], [address] [place], [place] [address]   Dg Finger Middle Left  Result Date: 12/30/2017 CLINICAL DATA:  Cellulitis.  Evaluate for osteomyelitis. EXAM: LEFT MIDDLE FINGER 2+V COMPARISON:  No prior. FINDINGS: Soft tissue swelling. No radiopaque foreign body. No acute or focal bony abnormality identified. No bony erosions noted. IMPRESSION: 1. Soft tissue swelling. No radiopaque foreign body. 2. No focal bony abnormality. Electronically Signed   By: Marcello Moores  Register   On: 12/30/2017 09:53    Review of Systems  Constitutional: Negative for  weight loss.  HENT: Negative for ear discharge, ear pain, hearing loss and tinnitus.   Eyes: Negative for blurred vision, double vision, photophobia and pain.  Respiratory: Negative for cough, sputum production and shortness of breath.   Cardiovascular: Negative for chest pain.  Gastrointestinal: Negative for abdominal pain, nausea and vomiting.  Genitourinary: Negative for dysuria, flank pain, frequency and urgency.  Musculoskeletal: Positive for joint pain (Left middle finger/hand). Negative for back pain, falls, myalgias and neck pain.  Neurological: Negative for dizziness, tingling, sensory change, focal weakness, loss of consciousness and headaches.  Endo/Heme/Allergies: Does not bruise/bleed easily.  Psychiatric/Behavioral: Negative for depression, memory loss and substance abuse. The patient is not nervous/anxious.     Blood pressure 130/71, pulse 72, temperature 98.6 F (37 C), temperature source Oral, resp. rate 18, height 6' 1"  (1.854 m), weight 85.7 kg (189 lb), SpO2 100 %. Physical Exam  Constitutional: He appears well-developed and well-nourished. No distress.  HENT:  Head: Normocephalic and atraumatic.  Eyes: Conjunctivae are normal. Right eye exhibits no discharge. Left eye exhibits no discharge. No scleral icterus.  Neck: Normal range of motion.  Cardiovascular: Normal rate and regular rhythm.  Respiratory: Effort normal. No respiratory distress.  Musculoskeletal:  Left shoulder, elbow, wrist, digits- Ulnar proximal middle finger with ulceration, edema, necrotic tissue, mod TTP, mild erythema dorsum of hand, no instability, no blocks to motion except 2/2 pain  Sens  Ax/R/M/U intact  Mot   Ax/ R/ PIN/ M/ AIN/ U intact  Rad 2+  Neurological: He is alert.  Skin: Skin is warm and dry. He is not diaphoretic.  Psychiatric: He has a normal mood and affect. His behavior is normal.     Assessment/Plan Left middle finger wound/infection -- For I&D this evening by Dr. Amedeo Plenty.  Given location and lack of other risk factors spider bite seems a likely etiology. NPO until then.    Lisette Abu, PA-C Orthopedic Surgery [phone] 12/30/2017, 3:35 PM

## 2017-12-30 NOTE — ED Triage Notes (Addendum)
Pt reports possible insect bite x 1 week; significant amt of edema and redness to LT middle finger with open wound; Ibuprofen 800mg  and tylenol 1000mg  at 0400

## 2017-12-30 NOTE — ED Notes (Signed)
Pt remains at registration

## 2017-12-30 NOTE — Anesthesia Postprocedure Evaluation (Signed)
Anesthesia Post Note  Patient: [name]  Procedure(s) Performed: IRRIGATION AND DEBRIDEMENT HAND (Left )     Patient location during evaluation: PACU Anesthesia Type: General Level of consciousness: awake and alert Pain management: pain level controlled Vital Signs Assessment: post-procedure vital signs reviewed and stable Respiratory status: spontaneous breathing, nonlabored ventilation and respiratory function stable Cardiovascular status: blood pressure returned to baseline and stable Postop Assessment: no apparent nausea or vomiting Anesthetic complications: no    Last Vitals:  Vitals:   12/30/17 2100 12/30/17 2115  BP: 129/86   Pulse: (!) 44 (!) 55  Resp: 20 15  Temp: 36.7 C   SpO2: 100% 100%    Last Pain:  Vitals:   12/30/17 2100  TempSrc:   PainSc: 4                  Jeffie Widdowson,W. EDMOND

## 2017-12-30 NOTE — Progress Notes (Signed)
Pharmacy Antibiotic Note  [name] is a 47 y.o. male admitted on 12/30/2017 with finger infection  Plan: vanc 1 g q8 Zosyn 3.375 gm q8  Height: 6\' 1"  (185.4 cm) Weight: 189 lb (85.7 kg) IBW/kg (Calculated) : 79.9  Temp (24hrs), Avg:98.1 F (36.7 C), Min:97.7 F (36.5 C), Max:98.6 F (37 C)  Recent Labs  Lab 12/30/17 0948  WBC 4.2  CREATININE 1.08    Estimated Creatinine Clearance: 98.6 mL/min (by C-G formula based on SCr of 1.08 mg/dL).    Allergies  Allergen Reactions  . Aspirin Anaphylaxis  . Morphine Rash  . Ketorolac Tromethamine Rash  . Tramadol Rash    Face and arms. Patient states that he is allergic to tramadol   Isaac BlissMichael Raygan Skarda, PharmD, BCPS, BCCCP Clinical Pharmacist Clinical phone for 12/30/2017 from 1430 [phone]: [phone] If after 2300, please call main pharmacy at: [phone] 12/30/2017 9:16 PM

## 2017-12-31 ENCOUNTER — Encounter (HOSPITAL_COMMUNITY): Payer: Self-pay

## 2017-12-31 LAB — CBC WITH DIFFERENTIAL/PLATELET
BASOS ABS: 0 10*3/uL (ref 0.0–0.1)
Basophils Relative: 0 %
EOS ABS: 0 10*3/uL (ref 0.0–0.7)
EOS PCT: 0 %
HCT: 37.1 % — ABNORMAL LOW (ref 39.0–52.0)
HEMOGLOBIN: 12.1 g/dL — AB (ref 13.0–17.0)
LYMPHS PCT: 15 %
Lymphs Abs: 0.9 10*3/uL (ref 0.7–4.0)
MCH: 29.7 pg (ref 26.0–34.0)
MCHC: 32.6 g/dL (ref 30.0–36.0)
MCV: 91.2 fL (ref 78.0–100.0)
Monocytes Absolute: 0.1 10*3/uL (ref 0.1–1.0)
Monocytes Relative: 1 %
Neutro Abs: 5 10*3/uL (ref 1.7–7.7)
Neutrophils Relative %: 84 %
PLATELETS: 254 10*3/uL (ref 150–400)
RBC: 4.07 MIL/uL — AB (ref 4.22–5.81)
RDW: 13.2 % (ref 11.5–15.5)
WBC: 5.9 10*3/uL (ref 4.0–10.5)

## 2017-12-31 NOTE — Care Management (Signed)
Case manager will continue to follow for possible discharge needs. Will follow up with worker's comp on Monday 01/02/18.

## 2017-12-31 NOTE — Progress Notes (Signed)
Patient ID: Corey FujitaMarcus L Colvin, male   DOB: 1971-01-27, 47 y.o.   MRN: [id number]   Patient seen and examined at bedside.  Patient had a long discussion about his plan of care.  At present time his examination reveals the splint to be clean dry and intact.  There is no signs of ascending cellulitis or dystrophic reaction.  He is sensate about the tip of the finger and there is good refill.  He moves this without significant appreciable pain.  Present juncture given the necrosis and his significant soft tissue disarray I would recommend moving forward with repeat irrigation debridement tomorrow morning in the operative arena.  Patient is consented for this.  I had a very long and detailed discussion with the patient followed by phone conversation with his wife regarding the events of last night.  I was called last night late to be informed that the patient was angry about all circumstances.  I am unsure of all of the issues but do note that the patient's wife has had a very difficult course on 5 N. in the past as she had 2 surgeries within the last 6 months according to the patient and his wife.  I have not looked at the wife's records only have discussed verbally their complaints etc.  At present time I discussed with patient and his wife that certainly he has a major upper extremity infectious predicament and that I would recommend that he continue seeking the treatment as outlined by myself.  This requires inpatient admission IV antibiotics awaiting the cultures and repeat washout.  I status from a position of respect and a position as a healthcare provider who is trying to get this patient back to his full function.  I feel that after our conversation he his wife and all staff understand the situation.  I would recommend that we all, each other from a position of respect and left to try to give him a better upper extremity.  I do understand that he is frustrated as he is in the hospital and  he is a young healthy man.  Nevertheless we do have to get through this and I do feel that he understands this.  I spent greater than an hour with these issues and do feel that we are on the same page.  It was noted that I talked to the charge nurse at length.  I would recommend a different nurse for him then last night just so that he can get a fresh start.  We have discussed with the patient the issues regarding their infection to the extremity. We will continue antibiotics and await culture results. Often times it will take 3-5 days for cultures to become final. During this time we will typically have the patient on intravenous antibiotics until we can find a parenteral route of antibiotic regime specific for the bacteria or organism isolated. We have discussed with the patient the need for daily irrigation and debridement as well as therapy to the area. We have discussed with the patient the necessity of range of motion to the involved joints as discussed today. We have discussed with the patient the unpredictability of infections at times. We'll continue to work towards good pain control and restoration of function. The patient understands the need for meticulous wound care and the necessity of proper followup.  The possible complications of stiffness (loss of motion), resistant infection, possible deep bone infection, possible chronic pain issues, possible need for multiple surgeries and even amputation.  With this in mind the patient understands our goal is to eradicate the infection to quiesence. We will continue to work towards these goals.  I will see him in the operative theater tomorrow     Kaycen Whitworth MD

## 2017-12-31 NOTE — Progress Notes (Signed)
Patient has returned safely to the unit.

## 2017-12-31 NOTE — Progress Notes (Signed)
Patient has left the unit.  He stated he will be back in 5 minutes.  Patient educated on the risks involved for leaving unit taking pain medications and without assistance.  He said that he is aware of risks and he will be back in 5 minutes.

## 2018-01-01 ENCOUNTER — Inpatient Hospital Stay (HOSPITAL_COMMUNITY): Payer: BLUE CROSS/BLUE SHIELD | Admitting: Certified Registered Nurse Anesthetist

## 2018-01-01 ENCOUNTER — Encounter (HOSPITAL_COMMUNITY)
Admission: EM | Disposition: A | Discharge status: Home / Self Care | Payer: Self-pay | Source: Home / Self Care | Attending: Orthopedic Surgery

## 2018-01-01 ENCOUNTER — Encounter (HOSPITAL_COMMUNITY): Payer: Self-pay | Admitting: Certified Registered Nurse Anesthetist

## 2018-01-01 HISTORY — PX: I&D EXTREMITY: SHX5045

## 2018-01-01 SURGERY — IRRIGATION AND DEBRIDEMENT EXTREMITY
Anesthesia: General | Site: Hand | Laterality: Left

## 2018-01-01 MED ORDER — HYDROMORPHONE HCL 1 MG/ML IJ SOLN
INTRAMUSCULAR | Status: AC
  Administered 2018-01-01: 0.5 mg via INTRAVENOUS
  Filled 2018-01-01: qty 1

## 2018-01-01 MED ORDER — ONDANSETRON HCL 4 MG/2ML IJ SOLN
INTRAMUSCULAR | Status: AC
  Filled 2018-01-01: qty 2

## 2018-01-01 MED ORDER — DEXAMETHASONE SODIUM PHOSPHATE 10 MG/ML IJ SOLN
INTRAMUSCULAR | Status: AC
  Filled 2018-01-01: qty 1

## 2018-01-01 MED ORDER — PROPOFOL 10 MG/ML IV BOLUS
INTRAVENOUS | Status: AC
  Filled 2018-01-01: qty 40

## 2018-01-01 MED ORDER — FENTANYL CITRATE (PF) 100 MCG/2ML IJ SOLN
INTRAMUSCULAR | Status: DC | PRN
  Administered 2018-01-01 (×2): 50 ug via INTRAVENOUS
  Administered 2018-01-01: 100 ug via INTRAVENOUS

## 2018-01-01 MED ORDER — DIPHENHYDRAMINE HCL 50 MG/ML IJ SOLN
INTRAMUSCULAR | Status: DC | PRN
  Administered 2018-01-01: 25 mg via INTRAVENOUS

## 2018-01-01 MED ORDER — HYDROMORPHONE HCL 1 MG/ML IJ SOLN
0.2500 mg | INTRAMUSCULAR | Status: DC | PRN
  Administered 2018-01-01 (×4): 0.5 mg via INTRAVENOUS

## 2018-01-01 MED ORDER — SODIUM CHLORIDE 0.9 % IR SOLN
Status: DC | PRN
  Administered 2018-01-01: 1000 mL

## 2018-01-01 MED ORDER — MIDAZOLAM HCL 2 MG/2ML IJ SOLN
INTRAMUSCULAR | Status: AC
  Filled 2018-01-01: qty 2

## 2018-01-01 MED ORDER — LACTATED RINGERS IV SOLN
INTRAVENOUS | Status: DC | PRN
  Administered 2018-01-01: 07:00:00 via INTRAVENOUS

## 2018-01-01 MED ORDER — PROPOFOL 10 MG/ML IV BOLUS
INTRAVENOUS | Status: DC | PRN
  Administered 2018-01-01: 200 mg via INTRAVENOUS

## 2018-01-01 MED ORDER — ONDANSETRON HCL 4 MG/2ML IJ SOLN
INTRAMUSCULAR | Status: DC | PRN
  Administered 2018-01-01: 4 mg via INTRAVENOUS

## 2018-01-01 MED ORDER — MIDAZOLAM HCL 5 MG/5ML IJ SOLN
INTRAMUSCULAR | Status: DC | PRN
  Administered 2018-01-01: 2 mg via INTRAVENOUS

## 2018-01-01 MED ORDER — LIDOCAINE 2% (20 MG/ML) 5 ML SYRINGE
INTRAMUSCULAR | Status: DC | PRN
  Administered 2018-01-01: 60 mg via INTRAVENOUS

## 2018-01-01 MED ORDER — DEXAMETHASONE SODIUM PHOSPHATE 10 MG/ML IJ SOLN
INTRAMUSCULAR | Status: DC | PRN
  Administered 2018-01-01: 5 mg via INTRAVENOUS

## 2018-01-01 MED ORDER — FENTANYL CITRATE (PF) 250 MCG/5ML IJ SOLN
INTRAMUSCULAR | Status: AC
  Filled 2018-01-01: qty 5

## 2018-01-01 MED ORDER — OXYCODONE HCL 5 MG PO TABS
ORAL_TABLET | ORAL | Status: AC
  Administered 2018-01-01: 10 mg via ORAL
  Filled 2018-01-01: qty 2

## 2018-01-01 SURGICAL SUPPLY — 41 items
BANDAGE ACE 4X5 VEL STRL LF (GAUZE/BANDAGES/DRESSINGS) ×3 IMPLANT
BNDG COHESIVE 3X5 TAN STRL LF (GAUZE/BANDAGES/DRESSINGS) ×3 IMPLANT
BNDG CONFORM 2 STRL LF (GAUZE/BANDAGES/DRESSINGS) ×3 IMPLANT
BNDG GAUZE ELAST 4 BULKY (GAUZE/BANDAGES/DRESSINGS) ×3 IMPLANT
CORDS BIPOLAR (ELECTRODE) ×3 IMPLANT
COVER SURGICAL LIGHT HANDLE (MISCELLANEOUS) ×3 IMPLANT
CUFF TOURNIQUET SINGLE 18IN (TOURNIQUET CUFF) ×6 IMPLANT
CUFF TOURNIQUET SINGLE 24IN (TOURNIQUET CUFF) IMPLANT
DRSG ADAPTIC 3X8 NADH LF (GAUZE/BANDAGES/DRESSINGS) ×3 IMPLANT
DRSG MEPITEL 4X7.2 (GAUZE/BANDAGES/DRESSINGS) ×3 IMPLANT
GAUZE SPONGE 4X4 12PLY STRL (GAUZE/BANDAGES/DRESSINGS) ×3 IMPLANT
GAUZE XEROFORM 1X8 LF (GAUZE/BANDAGES/DRESSINGS) ×3 IMPLANT
GLOVE BIOGEL M 8.0 STRL (GLOVE) ×3 IMPLANT
GLOVE SS BIOGEL STRL SZ 8 (GLOVE) ×1 IMPLANT
GLOVE SUPERSENSE BIOGEL SZ 8 (GLOVE) ×2
GOWN STRL REUS W/ TWL LRG LVL3 (GOWN DISPOSABLE) ×1 IMPLANT
GOWN STRL REUS W/ TWL XL LVL3 (GOWN DISPOSABLE) ×2 IMPLANT
GOWN STRL REUS W/TWL LRG LVL3 (GOWN DISPOSABLE) ×2
GOWN STRL REUS W/TWL XL LVL3 (GOWN DISPOSABLE) ×4
KIT BASIN OR (CUSTOM PROCEDURE TRAY) ×3 IMPLANT
KIT ROOM TURNOVER OR (KITS) ×3 IMPLANT
LOOP VESSEL MAXI BLUE (MISCELLANEOUS) ×3 IMPLANT
MANIFOLD NEPTUNE II (INSTRUMENTS) ×3 IMPLANT
NEEDLE HYPO 25GX1X1/2 BEV (NEEDLE) IMPLANT
NS IRRIG 1000ML POUR BTL (IV SOLUTION) ×3 IMPLANT
PACK ORTHO EXTREMITY (CUSTOM PROCEDURE TRAY) ×3 IMPLANT
PAD ARMBOARD 7.5X6 YLW CONV (MISCELLANEOUS) ×3 IMPLANT
PAD CAST 4YDX4 CTTN HI CHSV (CAST SUPPLIES) ×1 IMPLANT
PADDING CAST COTTON 4X4 STRL (CAST SUPPLIES) ×2
SCRUB BETADINE 4OZ XXX (MISCELLANEOUS) ×3 IMPLANT
SOL PREP POV-IOD 4OZ 10% (MISCELLANEOUS) ×3 IMPLANT
SPONGE LAP 4X18 X RAY DECT (DISPOSABLE) ×3 IMPLANT
SUT CHROMIC 4 0 PS 2 18 (SUTURE) ×3 IMPLANT
SWAB CULTURE ESWAB REG 1ML (MISCELLANEOUS) IMPLANT
SYR CONTROL 10ML LL (SYRINGE) IMPLANT
TOWEL OR 17X24 6PK STRL BLUE (TOWEL DISPOSABLE) ×3 IMPLANT
TOWEL OR 17X26 10 PK STRL BLUE (TOWEL DISPOSABLE) ×3 IMPLANT
TUBE CONNECTING 12'X1/4 (SUCTIONS) ×1
TUBE CONNECTING 12X1/4 (SUCTIONS) ×2 IMPLANT
WATER STERILE IRR 1000ML POUR (IV SOLUTION) ×3 IMPLANT
YANKAUER SUCT BULB TIP NO VENT (SUCTIONS) ×3 IMPLANT

## 2018-01-01 NOTE — Op Note (Signed)
Operative note  01/01/2018  Preoperative diagnosis: Left hand and middle finger infection with involvement of the extensor sheath and deep space Postop diagnosis: The same Procedure: #1 irrigation and debridement skin subtenons tissue peritenon and periosteum this was a excisional debridement with curette knife and scissor 3 x 2 cm                #2    radical extensor tenolysis tenosynovectomy left hand and middle finger                 #3   complex closure with rotation flap left middle finger  Surgeon Dominica SeverinWilliam Alvis Pulcini  Assistants none  Anesthesia General  Tourniquet time 0  Drains 1  Description of the procedure: Patient was taken to the operative theater and underwent smooth induction of general anesthetic.  He was counseled preoperatively in regards to his surgical management.  I discussed with he and his family all issues yesterday at great length and they desire to proceed.  Once in the operative theater I performed 2 separate Hibiclens scrubs followed by 10 minutes surgical Betadine scrub and paint.  Once this was completely then performed irrigation and debridement of skin subcutaneous tissue as well as tendon tissue and periosteal tissue.  Wound conditions were very favorable.  This was an irrigation and debridement of a 3 x 2 cm area.  There is a fairly significant void from necrosis of the skin.  This was dealt with with careful handling.  Once this was complete I performed a tenolysis tenosynovectomy of the extensor apparatus.  There were no complicating features with this.  This is an extensive debridement about the tendon sheath which was infected.  Overall the wound conditions were much improved.  Following this we then irrigated with 3 L of saline.  Once irrigation was accomplished I then performed a loose closure with rotation flap to cover the tendon.  3 different sutures were used to inset the rotated tissue so that the tendon would be covered.  I placed patient the  range of motion in all it quite well.  I placed a blue vessel loop drain in the area to allow for the aggression of fluid and then dressed the wound with Mepitel Xeroform and a splint as well as sterile bandage.  Going forward I would recommend moving in the direction of p.o. antibiotics once cultures are final.  At present time the cultures are not final and the initial Gram stain showed no organisms despite a very infected wound.  We will monitor the cultures and move forward accordingly.  We will continue with inpatient status and hopefully transition to home tomorrow if the cultures are final.  He tolerated procedure well and was stable in the recovery room.  I phoned his wife and gave her all the procedural findings    Reeder Brisby MD

## 2018-01-01 NOTE — Transfer of Care (Signed)
Immediate Anesthesia Transfer of Care Note  Patient: Corey FujitaMarcus L Willison  Procedure(s) Performed: IRRIGATION AND DEBRIDEMENT HAND (Left Hand)  Patient Location: PACU  Anesthesia Type:General  Level of Consciousness: drowsy  Airway & Oxygen Therapy: Patient Spontanous Breathing and Patient connected to nasal cannula oxygen  Post-op Assessment: Report given to RN and Post -op Vital signs reviewed and stable  Post vital signs: Reviewed and stable  Last Vitals:  Vitals:   01/01/18 0604 01/01/18 0842  BP: (!) 109/59 112/73  Pulse: 61 (!) 55  Resp: 16 (!) 5  Temp: 36.7 C   SpO2: 100% 99%    Last Pain:  Vitals:   01/01/18 0620  TempSrc:   PainSc: 6       Patients Stated Pain Goal: 3 (01/01/18 0520)  Complications: No apparent anesthesia complications

## 2018-01-01 NOTE — Progress Notes (Signed)
Patient has been disconnecting his iv without calling staff to assist.  Educated patient of the importance of allowing staff to maintain iv to prevent infection.  Patient stated that he will call staff next time before disconnecting iv.

## 2018-01-01 NOTE — Anesthesia Postprocedure Evaluation (Signed)
Anesthesia Post Note  Patient: [name]  Procedure(s) Performed: IRRIGATION AND DEBRIDEMENT HAND (Left Hand)     Patient location during evaluation: PACU Anesthesia Type: General Level of consciousness: awake and alert Pain management: pain level controlled Vital Signs Assessment: post-procedure vital signs reviewed and stable Respiratory status: spontaneous breathing, nonlabored ventilation and respiratory function stable Cardiovascular status: blood pressure returned to baseline and stable Postop Assessment: no apparent nausea or vomiting Anesthetic complications: no    Last Vitals:  Vitals:   01/01/18 1015 01/01/18 1038  BP: 117/77 114/69  Pulse: 64 61  Resp: 15 16  Temp: 36.6 C (!) 36.3 C  SpO2: 99% 98%    Last Pain:  Vitals:   01/01/18 1057  TempSrc:   PainSc: 3                  Coraima Tibbs,W. EDMOND

## 2018-01-01 NOTE — Progress Notes (Signed)
Pharmacy called about Robaxin - sent to %n - RN called 5 N- will retube to PACU

## 2018-01-01 NOTE — Progress Notes (Signed)
Robaxin IV gtt given to Vernona RiegerLaura, RN- not due until 1120- ortho tech paged - return call - per orders, RN requested mission sling for LUE- updated Vernona RiegerLaura, RN that pt would need a IV pole w/o a pump on it for Rembert to set up mission sling. Vernona RiegerLaura, RN verbalized an understanding- C. Wilena Tyndall unable to locate on on 5N at this time.

## 2018-01-01 NOTE — Anesthesia Preprocedure Evaluation (Addendum)
Anesthesia Evaluation  Patient identified by MRN, date of birth, ID band Patient awake    Reviewed: Allergy & Precautions, H&P , NPO status , Patient's Chart, lab work & pertinent test results  Airway Mallampati: II  TM Distance: >3 FB Neck ROM: Full    Dental no notable dental hx. (+) Poor Dentition, Dental Advisory Given   Pulmonary COPD, Current Smoker,    Pulmonary exam normal breath sounds clear to auscultation       Cardiovascular negative cardio ROS   Rhythm:Regular Rate:Normal     Neuro/Psych Anxiety Depression negative neurological ROS     GI/Hepatic negative GI ROS, Neg liver ROS,   Endo/Other  negative endocrine ROS  Renal/GU negative Renal ROS  negative genitourinary   Musculoskeletal  (+) Arthritis , Osteoarthritis,    Abdominal   Peds  Hematology negative hematology ROS (+)   Anesthesia Other Findings   Reproductive/Obstetrics negative OB ROS                            Anesthesia Physical Anesthesia Plan  ASA: II  Anesthesia Plan: General   Post-op Pain Management:    Induction: Intravenous  PONV Risk Score and Plan: 2 and Ondansetron, Dexamethasone and Midazolam  Airway Management Planned: LMA  Additional Equipment:   Intra-op Plan:   Post-operative Plan: Extubation in OR  Informed Consent: I have reviewed the patients History and Physical, chart, labs and discussed the procedure including the risks, benefits and alternatives for the proposed anesthesia with the patient or authorized representative who has indicated his/her understanding and acceptance.   Dental advisory given  Plan Discussed with: CRNA  Anesthesia Plan Comments:         Anesthesia Quick Evaluation

## 2018-01-01 NOTE — Progress Notes (Signed)
Patient to OR

## 2018-01-01 NOTE — Anesthesia Procedure Notes (Signed)
Procedure Name: LMA Insertion Date/Time: 01/01/2018 8:02 AM Performed by: Waynard EdwardsSmith, Raymar Joiner A, CRNA Pre-anesthesia Checklist: Patient identified, Emergency Drugs available, Suction available and Patient being monitored Patient Re-evaluated:Patient Re-evaluated prior to induction Oxygen Delivery Method: Circle system utilized Preoxygenation: Pre-oxygenation with 100% oxygen Induction Type: IV induction Ventilation: Mask ventilation without difficulty LMA: LMA inserted LMA Size: 4.0 Number of attempts: 1 Placement Confirmation: positive ETCO2 and breath sounds checked- equal and bilateral Tube secured with: Tape Dental Injury: Teeth and Oropharynx as per pre-operative assessment

## 2018-01-01 NOTE — Progress Notes (Signed)
Orthopedic Tech Progress Note Patient Details:  Paulita FujitaMarcus L Friis [birth year] [id number]  Ortho Devices Type of Ortho Device: Sling arm elevator Ortho Device/Splint Location: lue Ortho Device/Splint Interventions: Application   Post Interventions Patient Tolerated: Well Instructions Provided: Care of device   Nikki DomCrawford, Jarion Hawthorne 01/01/2018, 10:55 AM

## 2018-01-02 ENCOUNTER — Encounter (HOSPITAL_COMMUNITY): Payer: Self-pay | Admitting: Orthopedic Surgery

## 2018-01-02 LAB — BASIC METABOLIC PANEL
Anion gap: 8 (ref 5–15)
BUN: 20 mg/dL (ref 6–20)
CHLORIDE: 107 mmol/L (ref 101–111)
CO2: 24 mmol/L (ref 22–32)
CREATININE: 1.04 mg/dL (ref 0.61–1.24)
Calcium: 9 mg/dL (ref 8.9–10.3)
GFR calc non Af Amer: >60 mL/min (ref >60)
GLUCOSE: 99 mg/dL (ref 65–99)
Potassium: 4.4 mmol/L (ref 3.5–5.1)
Sodium: 139 mmol/L (ref 135–145)

## 2018-01-02 MED ORDER — HYDROCODONE-ACETAMINOPHEN 5-325 MG PO TABS: 1.0000 | ORAL_TABLET | Freq: Four times a day (QID) | ORAL | 0 refills | Status: AC | PRN

## 2018-01-02 MED ORDER — DOXYCYCLINE HYCLATE 50 MG PO CAPS: 50.0000 mg | ORAL_CAPSULE | Freq: Two times a day (BID) | ORAL | 0 refills | Status: AC

## 2018-01-02 NOTE — Discharge Instructions (Signed)

## 2018-01-02 NOTE — Op Note (Signed)
NAMBradly Bienenstock:  Peine, Guido               ACCOUNT NO.:  [id number]  MEDICAL RECORD NO.:  [id number]  LOCATION:                                 FACILITY:  PHYSICIAN:  Dionne AnoWilliam M. Baylee Mccorkel, M.D.DATE OF BIRTH:  August 07, 1971  DATE OF PROCEDURE: DATE OF DISCHARGE:                              OPERATIVE REPORT   PREOPERATIVE DIAGNOSES:  Left hand third web space abscess, left hand/finger extensor tenosynovitis, infectious in nature, left middle finger volar abscess outside the tendon sheath.  POSTOPERATIVE DIAGNOSES:  Left hand third web space abscess, left hand/finger extensor tenosynovitis, infectious in nature, left middle finger volar abscess outside the tendon sheath.  PROCEDURE: 1. Irrigation and debridement deep abscess, left webspace and finger     about the left middle finger. 2. Neurolysis, ulnar digital nerve, left middle finger. 3. Extensor tenolysis, tenosynovectomy, left middle finger and hand     extensive in nature.  SURGEON:  Dionne AnoWilliam M. Amanda PeaGramig, MD.  ASSISTANT:  None.  COMPLICATIONS:  None.  ANESTHESIA:  General.  TOURNIQUET TIME:  Less than an hour.  INDICATIONS FOR THE PROCEDURE:  A 47 year old male with a history of infection about the left third webspace and middle finger ulnar aspect. He has a necrotic skin tissue and advanced disarray of the finger secondary to infectious process.  I have counseled him regarding risks and benefits of surgery.  [facility] and with his MDs.  He has had antibiotics including Keflex and Rocephin.  I have discussed with him all issues including his job demands, pain management issues, and other issues as they are to remain his predicament.  With this in mind, he desires to proceed.  All questions have been encouraged and answered preoperatively.  OPERATIVE PROCEDURE:  The patient was seen by myself and Anesthesia, taken to the operative theater and underwent smooth induction of  general anesthesia.  I prepped him with 2 separate Hibiclens scrubs, followed by Betadine scrub and paint about the left upper extremity.  Once this was complete and final time-out was called, we performed debridement of the skin.  He had de-epithelialized skin tissue throughout the hand and middle finger, this was removed.  Following this, he had an area of necrosis and sinus tract about the ulnar aspect of the left middle finger.  This was incised.  I immediately encountered a large amount of liquefied necrotic tissue.  This was cultured for aerobic and anaerobic culture.  Following this, we then very carefully and cautiously performed incision proximal and distal.  The abscess was removed.  This was an irrigation and debridement of deep abscess.  Following this, I performed an extensive tenolysis and tenosynovectomy of the extensor apparatus.  The EDC and the lateral bands as well as central tendon to the middle finger underwent removal of an inflammatory peel about the area.  The extensor tenolysis, tenosynovectomy was accomplished without difficulty and there were no complicating features. Once this was complete, we then turned our attention volarly.  The patient's ulnar digital nerve was carefully evaluated and removed from the necrotic tissue.  This required a neurolysis.  This was a distinct and separate portion  of the procedure.  The neurolysis, extensor tenolysis, and I and D of deep abscess were accomplished without difficulty.  I then performed 3 L of fluid through and through the area.  There was a large amount of liquified necrotic and ulcerative tissue.  We will have to see how he does and if can heal this.  He will have some secondary intent healing to occur and will need additional washouts.  The finger pinked up well in terms of refill at the conclusion of the case.  I placed him in a very stable bandage short-arm splint without difficulty and left the wound wide  open so that he could drain.  We will monitor his condition closely and check him frequently.  I do think this is going to be a difficult entity for him to heal. Nevertheless, we are going to try to do everything in our power to give him the best finger possible.     Dionne Ano. Amanda Pea, M.D.     [facility]  D:  12/30/2017  T:  12/31/2017  Job:  [id number]

## 2018-01-02 NOTE — Discharge Summary (Signed)
Physician Discharge Summary  Patient ID: [name] MRN: [id number] DOB/AGE: 12-15-1970 47 y.o.  Admit date: 12/30/2017 Discharge date:  01-02-2018  Admission Diagnoses: hand infection Past Medical History:  Diagnosis Date  . ADD (attention deficit disorder)   . Arthritis   . Chronic back pain   . COPD (chronic obstructive pulmonary disease) (HCC)   . Depression   . Seizures (HCC)    only once 12/30/2012    Discharge Diagnoses:  Active Problems:   Animal bite of left hand with infection   Abscess of left hand   Surgeries: Procedure(s): IRRIGATION AND DEBRIDEMENT HAND on 01/01/2018    Consultants: Treatment Team:  Dominica SeverinGramig, William, MD  Discharged Condition: Improved  Hospital Course: Corey FujitaMarcus L Lipkin is an 47 y.o. male who was admitted 12/30/2017 with a chief complaint of  Chief Complaint  Patient presents with  . Wound Infection  , and found to have a diagnosis of hand infection.  They were brought to the operating room on 01/01/2018 and underwent Procedure(s): IRRIGATION AND DEBRIDEMENT HAND.  The patient did well postoperatively. His pain was controlled. He received IV antibiotics throughout his stay. Cultures did reveal Staphylococcus aureus, however, final cultures were pending at day of discharge. Given his history of MRSA the patient was discharged home on doxycycline for 2 weeks. I should note at the time of his discharge date the wound was examined there was no signs of infection the wound was cleansed and we spent greater than 30 minutes at bedside instructing the patient on appropriate wound care at home. Dressing supplies were given. He had a long discussion with him in regards to his pain management regime. At this juncture he states he is only taking Suboxone and understands not to take this postoperatively while he is utilizing Norco. He and his wife states good understanding of this. Apparently, the multiple opioids that's listed to include oxycodone, methadone he is  not currently taking. We discussed all issues with him at length. We will see him in our office setting Friday for a recheck.  They were given perioperative antibiotics:  Anti-infectives (From admission, onward)   Start     Dose/Rate Route Frequency Ordered Stop   01/02/18 0000  doxycycline (VIBRAMYCIN) 50 MG capsule     50 mg Oral 2 times daily 01/02/18 0849     12/31/17 0600  ceFAZolin (ANCEF) IVPB 2g/100 mL premix  Status:  Discontinued     2 g 200 mL/hr over 30 Minutes Intravenous On call to O.R. 12/30/17 2110 12/30/17 2126   12/31/17 0600  vancomycin (VANCOCIN) IVPB 1000 mg/200 mL premix     1,000 mg 200 mL/hr over 60 Minutes Intravenous Every 8 hours 12/30/17 2115     12/30/17 2200  piperacillin-tazobactam (ZOSYN) IVPB 3.375 g     3.375 g 12.5 mL/hr over 240 Minutes Intravenous Every 8 hours 12/30/17 2115     12/30/17 1900  vancomycin (VANCOCIN) IVPB 1000 mg/200 mL premix  Status:  Discontinued     1,000 mg 200 mL/hr over 60 Minutes Intravenous  Once 12/30/17 1851 12/31/17 0951   12/30/17 1051  ceFEPIme (MAXIPIME) 1 g injection    Comments:  Abran DukeEggleston, Amanda   : cabinet override      12/30/17 1051 12/30/17 2259   12/30/17 1045  ceFEPIme (MAXIPIME) 1 g in sodium chloride 0.9 % 100 mL IVPB     1 g 200 mL/hr over 30 Minutes Intravenous  Once 12/30/17 1040 12/30/17 1230   12/30/17 1030  vancomycin (VANCOCIN) IVPB 1000 mg/200 mL premix     1,000 mg 200 mL/hr over 60 Minutes Intravenous  Once 12/30/17 1026 12/30/17 1230    .  They were given sequential compression devices, early ambulation for DVT prophylaxis.  Recent vital signs:  Patient Vitals for the past 24 hrs:  BP Temp Temp src Pulse Resp SpO2  01/02/18 0740 112/68 98.1 F (36.7 C) Oral 66 16 99 %  01/01/18 2035 (!) 124/57 97.8 F (36.6 C) Oral 73 18 99 %  01/01/18 1500 (!) 94/48 98.3 F (36.8 C) Oral 75 16 98 %  01/01/18 1038 114/69 (!) 97.3 F (36.3 C) Oral 61 16 98 %  01/01/18 1015 117/77 97.9 F (36.6 C) - 64  15 99 %  01/01/18 1012 - - - (!) 57 14 100 %  01/01/18 1000 106/71 97.9 F (36.6 C) - (!) 54 13 100 %  01/01/18 0957 - - - (!) 53 11 100 %  01/01/18 0945 - - - (!) 48 (!) 7 100 %  01/01/18 0944 - 97.9 F (36.6 C) - - - -  01/01/18 0943 - - - (!) 47 (!) 6 100 %  01/01/18 0942 108/71 - - (!) 55 12 100 %  01/01/18 0930 107/76 - - (!) 47 13 100 %  01/01/18 0927 - - - (!) 52 (!) 8 100 %  01/01/18 0915 109/73 - - (!) 54 10 100 %  01/01/18 0912 - - - (!) 52 (!) 9 100 %  01/01/18 0900 - - - 62 13 100 %  01/01/18 0857 116/75 - - (!) 58 15 100 %  .  Recent laboratory studies: No results found.  Discharge Medications:   Allergies as of 01/02/2018      Reactions   Aspirin Anaphylaxis   Morphine Rash   Ketorolac Tromethamine Rash   Tramadol Rash   Face and arms. Patient states that he is allergic to tramadol      Medication List    STOP taking these medications   buprenorphine-naloxone 8-2 mg Subl SL tablet Commonly known as:  SUBOXONE   cephALEXin 250 MG capsule Commonly known as:  KEFLEX   methadone 10 MG tablet Commonly known as:  DOLOPHINE   methocarbamol 500 MG tablet Commonly known as:  ROBAXIN   oxyCODONE-acetaminophen 5-325 MG tablet Commonly known as:  PERCOCET/ROXICET     TAKE these medications   amphetamine-dextroamphetamine 20 MG tablet Commonly known as:  ADDERALL Take 20 mg by mouth 3 (three) times daily.   b complex vitamins tablet Take 1 tablet by mouth daily.   doxycycline 50 MG capsule Commonly known as:  VIBRAMYCIN Take 1 capsule (50 mg total) by mouth 2 (two) times daily. Please take a total of 100 mg twice daily,  Two 50 mg capsules twice daily   gabapentin 400 MG capsule Commonly known as:  NEURONTIN Take 1 capsule (400 mg total) by mouth 3 (three) times daily. What changed:  Another medication with the same name was removed. Continue taking this medication, and follow the directions you see here.   HYDROcodone-acetaminophen 5-325 MG  tablet Commonly known as:  NORCO Take 1 tablet by mouth every 6 (six) hours as needed for up to 7 days for moderate pain.   ibuprofen 200 MG tablet Commonly known as:  ADVIL,MOTRIN Take 800 mg by mouth daily as needed for moderate pain.   metaxalone 800 MG tablet Commonly known as:  SKELAXIN Take 1 tablet (800 mg total) by mouth  3 (three) times daily.   multivitamin with minerals Tabs tablet Take 1 tablet by mouth daily.   tetrahydrozoline 0.05 % ophthalmic solution Place 1 drop into both eyes as needed (dry eyes).       Diagnostic Studies: Dg Finger Middle Left  Result Date: 12/30/2017 CLINICAL DATA:  Cellulitis.  Evaluate for osteomyelitis. EXAM: LEFT MIDDLE FINGER 2+V COMPARISON:  No prior. FINDINGS: Soft tissue swelling. No radiopaque foreign body. No acute or focal bony abnormality identified. No bony erosions noted. IMPRESSION: 1. Soft tissue swelling. No radiopaque foreign body. 2. No focal bony abnormality. Electronically Signed   By: Maisie Fus  Register   On: 12/30/2017 09:53    They benefited maximally from their hospital stay and there were no complications.     Disposition: 07-Left Against Medical Advice/Left Without Being Seen/Elopement Discharge Instructions    Call MD / Call 911   Complete by:  As directed    If you experience chest pain or shortness of breath, CALL 911 and be transported to the hospital emergency room.  If you develope a fever above 101 F, pus (white drainage) or increased drainage or redness at the wound, or calf pain, call your surgeon's office.   Constipation Prevention   Complete by:  As directed    Drink plenty of fluids.  Prune juice may be helpful.  You may use a stool softener, such as Colace (over the counter) 100 mg twice a day.  Use MiraLax (over the counter) for constipation as needed.   Diet - low sodium heart healthy   Complete by:  As directed    Discharge instructions   Complete by:  As directed    Keep bandage clean and dry.  Call  for any problems.  No smoking.  Criteria for driving a car: you should be off your pain medicine for 7-8 hours, able to drive one handed(confident), thinking clearly and feeling able in your judgement to drive. Continue elevation as it will decrease swelling.  If instructed by MD move your fingers within the confines of the bandage/splint.  Use ice if instructed by your MD. Call immediately for any sudden loss of feeling in your hand/arm or change in functional abilities of the extremity. We recommend that you to take vitamin C 1000 mg a day to promote healing. We also recommend that if you require  pain medicine that you take a stool softener to prevent constipation as most pain medicines will have constipation side effects. We recommend either Peri-Colace or Senokot and recommend that you also consider adding MiraLAX as well to prevent the constipation affects from pain medicine if you are required to use them. These medicines are over the counter and may be purchased at a local pharmacy. A cup of yogurt and a probiotic can also be helpful during the recovery process as the medicines can disrupt your intestinal environment. Please wrap dressings before U shower. Do not saturate wound in the shower. Once you are finished with a shower then gently wipe the wound with the provided saline and 4 x 4's and then re-wrapped with a dry dressing as demonstrated at bedside in the hospital. Please perform this wound care daily.   Increase activity slowly as tolerated   Complete by:  As directed      Follow-up Information    Dominica Severin, MD Follow up.   Specialty:  Orthopedic Surgery Why:  Please follow-up this Friday morning at 8 AM, call [phone] for any questions or concerns.  Contact information: [address] [place] [address] [phone]            Signed: Sheran Lawless 01/02/2018, 8:50 AM

## 2018-01-04 LAB — AEROBIC/ANAEROBIC CULTURE (SURGICAL/DEEP WOUND)

## 2018-01-04 LAB — AEROBIC/ANAEROBIC CULTURE W GRAM STAIN (SURGICAL/DEEP WOUND)

## 2018-01-31 LAB — FUNGUS CULTURE WITH STAIN

## 2018-01-31 LAB — FUNGUS CULTURE RESULT

## 2018-01-31 LAB — FUNGAL ORGANISM REFLEX

## 2019-11-30 ENCOUNTER — Ambulatory Visit (INDEPENDENT_AMBULATORY_CARE_PROVIDER_SITE_OTHER): Payer: Self-pay

## 2019-11-30 ENCOUNTER — Other Ambulatory Visit: Payer: Self-pay

## 2019-11-30 ENCOUNTER — Other Ambulatory Visit: Payer: Self-pay | Admitting: Occupational Medicine

## 2019-11-30 DIAGNOSIS — Z021 Encounter for pre-employment examination: Secondary | ICD-10-CM
